# Patient Record
Sex: Male | Born: 1940 | Race: White | Hispanic: No | Marital: Married | Smoking: Former smoker
Health system: Southern US, Community
[De-identification: ages and names within clinical notes are randomized; demographics above are authoritative.]

## PROBLEM LIST (undated history)

## (undated) DIAGNOSIS — I2510 Atherosclerotic heart disease of native coronary artery without angina pectoris: Secondary | ICD-10-CM

## (undated) DIAGNOSIS — N400 Enlarged prostate without lower urinary tract symptoms: Secondary | ICD-10-CM

## (undated) DIAGNOSIS — E7800 Pure hypercholesterolemia, unspecified: Secondary | ICD-10-CM

## (undated) DIAGNOSIS — I10 Essential (primary) hypertension: Secondary | ICD-10-CM

## (undated) HISTORY — DX: Atherosclerotic heart disease of native coronary artery without angina pectoris: I25.10

## (undated) HISTORY — PX: CATARACT EXTRACTION: SUR2

## (undated) HISTORY — DX: Essential (primary) hypertension: I10

## (undated) HISTORY — DX: Pure hypercholesterolemia, unspecified: E78.00

## (undated) HISTORY — PX: INGUINAL HERNIA REPAIR: SUR1180

---

## 1988-11-23 HISTORY — PX: CORONARY ANGIOPLASTY: SHX604

## 2004-09-22 ENCOUNTER — Ambulatory Visit (HOSPITAL_COMMUNITY): Admission: RE | Admit: 2004-09-22 | Discharge: 2004-09-22 | Payer: Self-pay | Admitting: Gastroenterology

## 2018-06-21 ENCOUNTER — Ambulatory Visit: Payer: Self-pay | Admitting: Surgery

## 2018-06-21 ENCOUNTER — Encounter: Payer: Self-pay | Admitting: Surgery

## 2018-06-21 DIAGNOSIS — K4020 Bilateral inguinal hernia, without obstruction or gangrene, not specified as recurrent: Secondary | ICD-10-CM | POA: Insufficient documentation

## 2018-06-21 NOTE — H&P (Signed)
Dustin Mcclure Documented: 06/21/2018 8:36 AM Location: [facility] Patient #: [id number] DOB: 04/30/1941 Married / Language: English / Race: White Male  History of Present Illness Dustin Hector MD; 06/21/2018 1:38 PM) The patient is a 77 year old male who presents with an inguinal hernia. Note for "Inguinal hernia": ` ` ` Patient sent for surgical consultation at the request of Dr Harrington Challenger  Chief Complaint: Large right inguinal hernia. Possible left inguinal hernia ` ` The patient is a pleasant gentleman whose phone a lump in his right groin for many years. Usually not bothersome. However he felt like it gotten larger. Some discomfort. Discussed with primary care physician. Right inguinal hernia confirmed. Suspicion for left inguinal hernia as well. Surgical consultation requested. 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 Has some nocturia. Usually goes once or twice a night. When this felt more swollen and uncomfortable he was going much more often. It concerns him. However since the appointment was made for surgery, he's back down to his baseline nocturia. Denies any difficulty starting or maintaining stream. Not on any prostate medications. 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 He longer smokes and has not had any coronary issues. He walks on his treadmill at home most days. At least 15 minutes. Was his bowels every day. No abdominal surgery. No fevers or chills. He comes in wondering if the hernia needs to be fixed now that it is larger. However he feels better since he saw the primary care physician, so he is undecided.  (Review of systems as stated in this history (HPI) or in the review of systems. Otherwise all other 12 point ROS are negative) ` ` `   Past Surgical History Sabino Gasser; 06/21/2018 8:44 AM) Colon Polyp Removal - Colonoscopy  Diagnostic Studies History Sabino Gasser; 06/21/2018 8:44  AM) Colonoscopy 1-5 years ago  Allergies Sabino Gasser; 06/21/2018 8:44 AM) No Known Drug Allergies [06/21/2018]: Allergies Reconciled  Medication History Sabino Gasser; 06/21/2018 8:45 AM) Atorvastatin Calcium (40MG  Tablet, Oral) Active. Aspirin (81MG  Tablet DR, Oral) Active. Metoprolol Succinate ER (50MG  Tablet ER 24HR, Oral) Active. Medications Reconciled  Social History Sabino Gasser; 06/21/2018 8:44 AM) Alcohol use Moderate alcohol use. Caffeine use Coffee, Tea. No drug use Tobacco use Former smoker.  Family History Sabino Gasser; 06/21/2018 8:44 AM) Heart Disease Brother. Hypertension Brother.  Other Problems Sabino Gasser; 06/21/2018 8:44 AM) High blood pressure Hypercholesterolemia Myocardial infarction Other disease, cancer, significant illness     Review of Systems Sabino Gasser; 06/21/2018 8:44 AM) General Not Present- Appetite Loss, Chills, Fatigue, Fever, Night Sweats, Weight Gain and Weight Loss. Skin Not Present- Change in Wart/Mole, Dryness, Hives, Jaundice, New Lesions, Non-Healing Wounds, Rash and Ulcer. HEENT Present- Seasonal Allergies. Not Present- Earache, Hearing Loss, Hoarseness, Nose Bleed, Oral Ulcers, Ringing in the Ears, Sinus Pain, Sore Throat, Visual Disturbances, Wears glasses/contact lenses and Yellow Eyes. Respiratory Not Present- Bloody sputum, Chronic Cough, Difficulty Breathing, Snoring and Wheezing. Breast Not Present- Breast Mass, Breast Pain, Nipple Discharge and Skin Changes. Cardiovascular Not Present- Chest Pain, Difficulty Breathing Lying Down, Leg Cramps, Palpitations, Rapid Heart Rate, Shortness of Breath and Swelling of Extremities. Gastrointestinal Not Present- Abdominal Pain, Bloating, Bloody Stool, Change in Bowel Habits, Chronic diarrhea, Constipation, Difficulty Swallowing, Excessive gas, Gets full quickly at meals, Hemorrhoids, Indigestion, Nausea, Rectal Pain and Vomiting. Male Genitourinary Present-  Nocturia. Not Present- Blood in Urine, Change in Urinary Stream, Frequency, Impotence, Painful Urination, Urgency and Urine Leakage. Musculoskeletal Not Present- Back  Pain, Joint Pain, Joint Stiffness, Muscle Pain, Muscle Weakness and Swelling of Extremities. Neurological Not Present- Decreased Memory, Fainting, Headaches, Numbness, Seizures, Tingling, Tremor, Trouble walking and Weakness. Psychiatric Not Present- Anxiety, Bipolar, Change in Sleep Pattern, Depression, Fearful and Frequent crying. Endocrine Not Present- Cold Intolerance, Excessive Hunger, Hair Changes, Heat Intolerance, Hot flashes and New Diabetes. Hematology Not Present- Blood Thinners, Easy Bruising, Excessive bleeding, Gland problems, HIV and Persistent Infections.  Vitals Sabino Gasser; 06/21/2018 8:46 AM) 06/21/2018 8:45 AM Weight: 198.13 lb Height: 69in Body Surface Area: 2.06 m Body Mass Index: 29.26 kg/m  Temp.: 99.22F(Oral)  Pulse: 56 (Regular)  BP: 132/84 (Sitting, Left Arm, Standard)      Physical Exam Dustin Hector MD; 06/21/2018 1:39 PM)  General Mental Status-Alert. General Appearance-Not in acute distress, Not Sickly. Orientation-Oriented X3. Hydration-Well hydrated. Voice-Normal.  Integumentary Global Assessment Upon inspection and palpation of skin surfaces of the - Axillae: non-tender, no inflammation or ulceration, no drainage. and Distribution of scalp and body hair is normal. General Characteristics Temperature - normal warmth is noted. 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 Face Global Assessment - atraumatic, no absence of expression. Neck Global Assessment - no abnormal movements, no bruit auscultated on the right, no bruit auscultated on the left, no decreased range of motion, non-tender. Trachea-midline. Thyroid Gland Characteristics -  non-tender.  Eye Eyeball - Left-Extraocular movements intact, No Nystagmus. Eyeball - Right-Extraocular movements intact, No Nystagmus. Cornea - Left-No Hazy. Cornea - Right-No Hazy. Sclera/Conjunctiva - Left-No scleral icterus, No Discharge. Sclera/Conjunctiva - Right-No scleral icterus, No Discharge. Pupil - Left-Direct reaction to light normal. Pupil - Right-Direct reaction to light normal.  ENMT Ears Pinna - Left - no drainage observed, no generalized tenderness observed. Right - no drainage observed, no generalized tenderness observed. Nose and Sinuses External Inspection of the Nose - no destructive lesion observed. Inspection of the nares - Left - quiet respiration. Right - quiet respiration. Mouth and Throat Lips - Upper Lip - no fissures observed, no pallor noted. Lower Lip - no fissures observed, no pallor noted. Nasopharynx - no discharge present. Oral Cavity/Oropharynx - Tongue - no dryness observed. Oral Mucosa - no cyanosis observed. Hypopharynx - no evidence of airway distress observed.  Chest and Lung Exam Inspection Movements - Normal and Symmetrical. Accessory muscles - No use of accessory muscles in breathing. Palpation Palpation of the chest reveals - Non-tender. Auscultation Breath sounds - Normal and Clear.  Cardiovascular Auscultation Rhythm - Regular. Murmurs & Other Heart Sounds - Auscultation of the heart reveals - No Murmurs and No Systolic Clicks.  Abdomen Inspection Inspection of the abdomen reveals - No Visible peristalsis and No Abnormal pulsations. Umbilicus - No Bleeding, No Urine drainage. Palpation/Percussion Palpation and Percussion of the abdomen reveal - Soft, Non Tender, No Rebound tenderness, No Rigidity (guarding) and No Cutaneous hyperesthesia. Note: Abdomen soft. Nontender. Not distended. No umbilical or incisional hernias. No guarding. No diastases  Male Genitourinary Sexual Maturity Tanner 5 - Adult hair  pattern and Adult penile size and shape. Note: Right greater than the left bilateral inguinal hernias. Right side was about 8 cm groin mass. Ultimately reducible. Testes cords and phallus otherwise normal.  Peripheral Vascular Upper Extremity Inspection - Left - No Cyanotic nailbeds, Not Ischemic. Right - No Cyanotic nailbeds, Not Ischemic.  Neurologic Neurologic evaluation reveals -normal attention span and ability to concentrate, able to name objects and repeat phrases. Appropriate fund of  knowledge , normal sensation and normal coordination. Mental Status Affect - not angry, not paranoid. Cranial Nerves-Normal Bilaterally. Gait-Normal.  Neuropsychiatric Mental status exam performed with findings of-able to articulate well with normal speech/language, rate, volume and coherence, thought content normal with ability to perform basic computations and apply abstract reasoning and no evidence of hallucinations, delusions, obsessions or homicidal/suicidal ideation.  Musculoskeletal Global Assessment Spine, Ribs and Pelvis - no instability, subluxation or laxity. Right Upper Extremity - no instability, subluxation or laxity.  Lymphatic Head & Neck  General Head & Neck Lymphatics: Bilateral - Description - No Localized lymphadenopathy. Axillary  General Axillary Region: Bilateral - Description - No Localized lymphadenopathy. Femoral & Inguinal  Generalized Femoral & Inguinal Lymphatics: Left - Description - No Localized lymphadenopathy. Right - Description - No Localized lymphadenopathy.    Assessment & Plan Dustin Hector MD; 06/21/2018 9:26 AM)  BILATERAL INGUINAL HERNIA WITHOUT OBSTRUCTION OR GANGRENE, RECURRENCE NOT SPECIFIED (K40.20) Impression: Bilateral inguinal hernias. Left smaller. Right moderate sized sent groin started to go down the scrotum.  Standard of care would be repair. Laparoscopic approach with mesh. Perhaps use a medium density on the right side at  the defect is large. Outpatient surgery.  He is interested in proceeding. Due to ago for summer vacation up in [place]. I think it may be wiser to wait until after that vacation and do this in September. It is not been severely debilitating despite the increase in size and intermittent discomfort in the past few weeks. Later this week. He is comfortable with waiting since I cannot do it immediately and I doubt he would be back to normal just a couple weeks before his vacation. We will work to coordinate a convenient time   Corder SURGICAL PROCEDURE (Z01.818)  Current Plans You are being scheduled for surgery- Our schedulers will call you.  You should hear from our office's scheduling department within 5 working days about the location, date, and time of surgery. We try to make accommodations for patient's preferences in scheduling surgery, but sometimes the OR schedule or the surgeon's schedule prevents [place] from making those accommodations.  If you have not heard from our office [phone]) in 5 working days, call the office and ask for your surgeon's nurse.  If you have other questions about your diagnosis, plan, or surgery, call the office and ask for your surgeon's nurse.  Written instructions provided The anatomy & physiology of the abdominal wall and pelvic floor was discussed. The pathophysiology of hernias in the inguinal and pelvic region was discussed. Natural history risks such as progressive enlargement, pain, incarceration, and strangulation was discussed. Contributors to complications such as smoking, obesity, diabetes, prior surgery, etc were discussed.  I feel the risks of no intervention will lead to serious problems that outweigh the operative risks; therefore, I recommended surgery to reduce and repair the hernia. I explained laparoscopic techniques with possible need for an open approach. I noted  usual use of mesh to patch and/or buttress hernia repair  Risks such as bleeding, infection, abscess, need for further treatment, heart attack, death, and other risks were discussed. I noted a good likelihood this will help address the problem. Goals of post-operative recovery were discussed as well. Possibility that this will not correct all symptoms was explained. I stressed the importance of low-impact activity, aggressive pain control, avoiding constipation, & not pushing through pain to minimize risk of post-operative chronic pain or injury. Possibility  of reherniation was discussed. We will work to minimize complications.  An educational handout further explaining the pathology & treatment options was given as well. Questions were answered. The patient expresses understanding & wishes to proceed with surgery.  Pt Education - Pamphlet Given - Laparoscopic Hernia Repair: discussed with patient and provided information. Pt Education - CCS Pain Control (Yuto Cajuste) Pt Education - CCS Hernia Post-Op HCI (Sanjana Folz): discussed with patient and provided information. Pt Education - CCS Mesh education: discussed with patient and provided information.  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  I'm skeptical that his bladder is in the hernia but that is a possibility.  I think it would be a good idea to place him on Flomax perioperatively to minimize risk of urinary retention. Low threshold to have him recheck to his urologist if it is a persistent or worsening symptoms. Perhaps his urination will get better once the hernias were fixed. We will see.  Current Plans Started Tamsulosin HCl 0.4 MG Oral Capsule, 1 (one) Capsule daily, #14, 14 days starting 06/21/2018, Ref. x2. Local Order: Start 1 week before hernia surgery to help with urination  Dustin Hector, MD, FACS, MASCRS Gastrointestinal and Minimally  Invasive Surgery    [address], [place], [place] [address] [phone] Main / Paging [phone] Fax

## 2020-12-12 DIAGNOSIS — I10 Essential (primary) hypertension: Secondary | ICD-10-CM | POA: Diagnosis not present

## 2020-12-12 DIAGNOSIS — Z0000 Encounter for general adult medical examination without abnormal findings: Secondary | ICD-10-CM | POA: Diagnosis not present

## 2020-12-12 DIAGNOSIS — E782 Mixed hyperlipidemia: Secondary | ICD-10-CM | POA: Diagnosis not present

## 2021-03-31 ENCOUNTER — Ambulatory Visit: Payer: Medicare Other | Admitting: Podiatry

## 2021-03-31 ENCOUNTER — Other Ambulatory Visit: Payer: Self-pay

## 2021-03-31 ENCOUNTER — Ambulatory Visit (INDEPENDENT_AMBULATORY_CARE_PROVIDER_SITE_OTHER): Payer: Medicare Other

## 2021-03-31 DIAGNOSIS — M2041 Other hammer toe(s) (acquired), right foot: Secondary | ICD-10-CM

## 2021-03-31 DIAGNOSIS — M2042 Other hammer toe(s) (acquired), left foot: Secondary | ICD-10-CM | POA: Diagnosis not present

## 2021-03-31 DIAGNOSIS — M205X2 Other deformities of toe(s) (acquired), left foot: Secondary | ICD-10-CM

## 2021-04-14 NOTE — Progress Notes (Signed)
   HPI: 80 y.o. male presenting today as a new patient for evaluation of intermittent low-grade foot pain to the left foot.  Is been going on for less than a year.  Gradual onset.  She denies a history of injury.  Occasionally she will have some pain on the ball of her foot.  She also has a hammertoe to the left second toe that she states is completely not painful.  She presents for further treatment evaluation.  Past Medical History:  Diagnosis Date  . CAD (coronary artery disease)   . HTN (hypertension)   . Hypercholesteremia      Physical Exam: General: The patient is alert and oriented x3 in no acute distress.  Dermatology: Skin is warm, dry and supple bilateral lower extremities. Negative for open lesions or macerations.  Vascular: Palpable pedal pulses bilaterally. No edema or erythema noted. Capillary refill within normal limits.  Neurological: Epicritic and protective threshold grossly intact bilaterally.   Musculoskeletal Exam: Range of motion within normal limits to all pedal and ankle joints bilateral. Muscle strength 5/5 in all groups bilateral.  There is some limited range of motion with very mild tenderness to palpation to the first MTPJ left consistent with a hallux limitus.  Reducible hammertoe deformity also noted to the second digit left  Radiographic Exam:  Normal osseous mineralization.  There is some joint space narrowing to the first MTPJ left. No fracture/dislocation/boney destruction.    Assessment: 1.  Hallux limitus left 2.  Hammertoe second digit left   Plan of Care:  1. Patient evaluated. X-Rays reviewed.  2.  Today we discussed conservative versus surgical management of the patient's pathology.  The patient would not like to pursue any surgery at this time.  I agree.  Currently it seems to be asymptomatic and very mild and intermittent. 3.  Continue wearing new balance shoes and good stability sneakers 4.  Return to clinic as needed      Edrick Kins, DPM [facility]  Dr. Edrick Kins, DPM    [address], [place] [address]                Office [phone]  Fax [phone]

## 2021-06-27 DIAGNOSIS — M79606 Pain in leg, unspecified: Secondary | ICD-10-CM | POA: Diagnosis not present

## 2021-07-22 DIAGNOSIS — Z1152 Encounter for screening for COVID-19: Secondary | ICD-10-CM | POA: Diagnosis not present

## 2021-08-20 DIAGNOSIS — M79605 Pain in left leg: Secondary | ICD-10-CM | POA: Diagnosis not present

## 2021-08-21 ENCOUNTER — Other Ambulatory Visit: Payer: Self-pay | Admitting: Sports Medicine

## 2021-08-21 ENCOUNTER — Ambulatory Visit
Admission: RE | Admit: 2021-08-21 | Discharge: 2021-08-21 | Disposition: A | Discharge status: Home / Self Care | Payer: Medicare Other | Source: Ambulatory Visit | Attending: Sports Medicine | Admitting: Sports Medicine

## 2021-08-21 ENCOUNTER — Other Ambulatory Visit: Payer: Self-pay

## 2021-08-21 DIAGNOSIS — M5440 Lumbago with sciatica, unspecified side: Secondary | ICD-10-CM

## 2021-08-21 DIAGNOSIS — M545 Low back pain, unspecified: Secondary | ICD-10-CM | POA: Diagnosis not present

## 2021-08-21 DIAGNOSIS — M25552 Pain in left hip: Secondary | ICD-10-CM

## 2021-09-01 DIAGNOSIS — M62552 Muscle wasting and atrophy, not elsewhere classified, left thigh: Secondary | ICD-10-CM | POA: Diagnosis not present

## 2021-09-01 DIAGNOSIS — M25652 Stiffness of left hip, not elsewhere classified: Secondary | ICD-10-CM | POA: Diagnosis not present

## 2021-09-01 DIAGNOSIS — M79605 Pain in left leg: Secondary | ICD-10-CM | POA: Diagnosis not present

## 2021-09-01 DIAGNOSIS — M62562 Muscle wasting and atrophy, not elsewhere classified, left lower leg: Secondary | ICD-10-CM | POA: Diagnosis not present

## 2021-09-01 DIAGNOSIS — R262 Difficulty in walking, not elsewhere classified: Secondary | ICD-10-CM | POA: Diagnosis not present

## 2021-09-01 DIAGNOSIS — R269 Unspecified abnormalities of gait and mobility: Secondary | ICD-10-CM | POA: Diagnosis not present

## 2021-09-01 DIAGNOSIS — R2681 Unsteadiness on feet: Secondary | ICD-10-CM | POA: Diagnosis not present

## 2021-12-10 DIAGNOSIS — I10 Essential (primary) hypertension: Secondary | ICD-10-CM | POA: Diagnosis not present

## 2021-12-10 DIAGNOSIS — E782 Mixed hyperlipidemia: Secondary | ICD-10-CM | POA: Diagnosis not present

## 2021-12-18 DIAGNOSIS — I10 Essential (primary) hypertension: Secondary | ICD-10-CM | POA: Diagnosis not present

## 2021-12-18 DIAGNOSIS — H1132 Conjunctival hemorrhage, left eye: Secondary | ICD-10-CM | POA: Diagnosis not present

## 2021-12-18 DIAGNOSIS — Z0000 Encounter for general adult medical examination without abnormal findings: Secondary | ICD-10-CM | POA: Diagnosis not present

## 2021-12-18 DIAGNOSIS — E782 Mixed hyperlipidemia: Secondary | ICD-10-CM | POA: Diagnosis not present

## 2021-12-26 ENCOUNTER — Emergency Department (HOSPITAL_COMMUNITY): Payer: Medicare Other

## 2021-12-26 ENCOUNTER — Other Ambulatory Visit: Payer: Self-pay

## 2021-12-26 ENCOUNTER — Emergency Department (HOSPITAL_COMMUNITY)
Admission: EM | Admit: 2021-12-26 | Discharge: 2021-12-27 | Disposition: A | Discharge status: Home / Self Care | Payer: Medicare Other | Attending: Emergency Medicine | Admitting: Emergency Medicine

## 2021-12-26 DIAGNOSIS — I10 Essential (primary) hypertension: Secondary | ICD-10-CM | POA: Insufficient documentation

## 2021-12-26 DIAGNOSIS — R079 Chest pain, unspecified: Secondary | ICD-10-CM | POA: Diagnosis not present

## 2021-12-26 DIAGNOSIS — R0789 Other chest pain: Secondary | ICD-10-CM | POA: Insufficient documentation

## 2021-12-26 DIAGNOSIS — I2510 Atherosclerotic heart disease of native coronary artery without angina pectoris: Secondary | ICD-10-CM | POA: Diagnosis not present

## 2021-12-26 DIAGNOSIS — Z7982 Long term (current) use of aspirin: Secondary | ICD-10-CM | POA: Insufficient documentation

## 2021-12-26 DIAGNOSIS — R072 Precordial pain: Secondary | ICD-10-CM | POA: Diagnosis not present

## 2021-12-26 DIAGNOSIS — Z79899 Other long term (current) drug therapy: Secondary | ICD-10-CM | POA: Insufficient documentation

## 2021-12-26 LAB — BASIC METABOLIC PANEL
Anion gap: 6 (ref 5–15)
BUN: 20 mg/dL (ref 8–23)
CO2: 29 mmol/L (ref 22–32)
Calcium: 9.2 mg/dL (ref 8.9–10.3)
Chloride: 99 mmol/L (ref 98–111)
Creatinine, Ser: 0.85 mg/dL (ref 0.61–1.24)
GFR, Estimated: >60 mL/min (ref >60)
Glucose, Bld: 107 mg/dL — ABNORMAL HIGH (ref 70–99)
Potassium: 4.4 mmol/L (ref 3.5–5.1)
Sodium: 134 mmol/L — ABNORMAL LOW (ref 135–145)

## 2021-12-26 LAB — CBC
HCT: 42.7 % (ref 39.0–52.0)
Hemoglobin: 14.4 g/dL (ref 13.0–17.0)
MCH: 30.1 pg (ref 26.0–34.0)
MCHC: 33.7 g/dL (ref 30.0–36.0)
MCV: 89.1 fL (ref 80.0–100.0)
Platelets: 218 10*3/uL (ref 150–400)
RBC: 4.79 MIL/uL (ref 4.22–5.81)
RDW: 12.9 % (ref 11.5–15.5)
WBC: 6.1 10*3/uL (ref 4.0–10.5)
nRBC: 0 % (ref 0.0–0.2)

## 2021-12-26 LAB — HEPATIC FUNCTION PANEL
ALT: 18 U/L (ref 0–44)
AST: 21 U/L (ref 15–41)
Albumin: 4.1 g/dL (ref 3.5–5.0)
Alkaline Phosphatase: 68 U/L (ref 38–126)
Bilirubin, Direct: 0.2 mg/dL (ref 0.0–0.2)
Indirect Bilirubin: 0.6 mg/dL (ref 0.3–0.9)
Total Bilirubin: 0.8 mg/dL (ref 0.3–1.2)
Total Protein: 6.9 g/dL (ref 6.5–8.1)

## 2021-12-26 LAB — TROPONIN I (HIGH SENSITIVITY): Troponin I (High Sensitivity): 5 ng/L (ref <18)

## 2021-12-26 MED ORDER — PANTOPRAZOLE SODIUM 40 MG PO TBEC
40.0000 mg | DELAYED_RELEASE_TABLET | Freq: Every day | ORAL | Status: DC
Start: 2021-12-26 — End: 2021-12-27
  Administered 2021-12-26: 40 mg via ORAL
  Filled 2021-12-26: qty 1

## 2021-12-26 NOTE — ED Provider Notes (Signed)
[facility] DEPT Provider Note   CSN: [id number] Arrival date & time: 12/26/21  2032     History  Chief Complaint  Patient presents with   Chest Pain    Dustin Mcclure is a 81 y.o. male.  Pt is a 81 yo wm who has a hx of CAD, HTN, and high cholesterol.  Pt ate some spicy foods tonight, then developed some CP.  He took some tums which helped.  He did have a MI 32 years ago, but has not seen a cardiologist in years.  He does follow closely with his pcp.  He just saw his pcp last week for an annual exam and things were looking good.  Pt still had some cp in triage, so he was given a protonix.  He has no cp now.  He did have elevation in his bp at home as well.  This worried him more than the cp.      Home Medications Prior to Admission medications   Medication Sig Start Date End Date Taking? Authorizing Provider  aspirin EC 81 MG tablet Take 81 mg by mouth daily. Swallow whole.   Yes [provider]  finasteride (PROSCAR) 5 MG tablet Take 5 mg by mouth daily. 09/05/21  Yes [provider]  metoprolol succinate (TOPROL-XL) 50 MG 24 hr tablet Take 50 mg by mouth in the morning. 11/20/21  Yes [provider]  Saccharomyces boulardii (PROBIOTIC) 250 MG CAPS Take 250 mg by mouth 3 (three) times a week.   Yes [provider]  vardenafil (LEVITRA) 20 MG tablet Take 10-20 mg by mouth daily as needed (as directed).   Yes [provider]  atorvastatin (LIPITOR) 40 MG tablet Take 40 mg by mouth See admin instructions. Take 40 mg by mouth every other NIGHT 12/08/21   [provider]      Allergies    Patient has no known allergies.    Review of Systems   Review of Systems  Cardiovascular:  Positive for chest pain.  All other systems reviewed and are negative.  Physical Exam Updated Vital Signs BP (!) 170/80    Pulse 66    Temp 98.7 F (37.1 C) (Oral)    Resp (!) 23    Ht 5\' 7"  (1.702 m)    Wt 83 kg    SpO2  99%    BMI 28.66 kg/m  Physical Exam Vitals and nursing note reviewed.  Constitutional:      Appearance: He is well-developed.  HENT:     Head: Normocephalic and atraumatic.  Eyes:     Extraocular Movements: Extraocular movements intact.     Pupils: Pupils are equal, round, and reactive to light.  Cardiovascular:     Rate and Rhythm: Normal rate and regular rhythm.     Heart sounds: Normal heart sounds.  Pulmonary:     Effort: Pulmonary effort is normal.     Breath sounds: Normal breath sounds.  Abdominal:     General: Bowel sounds are normal.     Palpations: Abdomen is soft.  Musculoskeletal:        General: Normal range of motion.     Cervical back: Normal range of motion and neck supple.  Skin:    General: Skin is warm.     Capillary Refill: Capillary refill takes less than 2 seconds.     Comments: Vitiligo noted  Neurological:     General: No focal deficit present.     Mental Status:  He is alert and oriented to person, place, and time.  Psychiatric:        Mood and Affect: Mood normal.        Behavior: Behavior normal.    ED Results / Procedures / Treatments   Labs (all labs ordered are listed, but only abnormal results are displayed) Labs Reviewed  BASIC METABOLIC PANEL - Abnormal; Notable for the following components:      Result Value   Sodium 134 (*)    Glucose, Bld 107 (*)    All other components within normal limits  CBC  HEPATIC FUNCTION PANEL  D-DIMER, QUANTITATIVE  TROPONIN I (HIGH SENSITIVITY)  TROPONIN I (HIGH SENSITIVITY)    EKG EKG Interpretation  Date/Time:  Friday December 26 2021 20:49:32 EST Ventricular Rate:  55 PR Interval:  177 QRS Duration: 99 QT Interval:  436 QTC Calculation: 417 R Axis:   27 Text Interpretation: Sinus rhythm Abnrm T, consider ischemia, anterolateral lds No old tracing to compare Confirmed by Dorie Rank [phone]) on 12/26/2021 8:56:02 PM  Radiology DG Chest 2 View  Result Date: 12/26/2021 CLINICAL DATA:  Chest  pain and discomfort.  Hypertension. EXAM: CHEST - 2 VIEW COMPARISON:  None. FINDINGS: Heart size is normal. Mild tortuosity of the aorta. Pulmonary vascularity is normal. The lungs are clear. No effusions. Ordinary degenerative changes affect the spine. IMPRESSION: No active cardiopulmonary disease. Electronically Signed   By: Nelson Chimes M.D.   On: 12/26/2021 21:16    Procedures Procedures    Medications Ordered in ED Medications  pantoprazole (PROTONIX) EC tablet 40 mg (40 mg Oral Given 12/26/21 2103)    ED Course/ Medical Decision Making/ A&P                           Medical Decision Making Amount and/or Complexity of Data Reviewed Labs: ordered. Radiology: ordered.   Pt is evaluated and cardiac labs are nl.  EKG does have some T wave changes.  CXR neg.  Pain is better after tums and protonix.  As pt does have a hx of CAD, he will be referred to cardiology.  BP is improved with rest.  If 2nd trop is neg, he can go home.  Pt and his wife know the plan.         Final Clinical Impression(s) / ED Diagnoses Final diagnoses:  Atypical chest pain    Rx / DC Orders ED Discharge Orders          Ordered    Ambulatory referral to Cardiology        12/27/21 0010              Isla Pence, MD 12/27/21 0010

## 2021-12-26 NOTE — ED Triage Notes (Signed)
Pt complains of chest pain after eating something spicy this afternoon. Pt states that his blood pressure is higher than normal.

## 2021-12-26 NOTE — ED Notes (Signed)
Lab will add on to prior collection

## 2021-12-26 NOTE — ED Provider Triage Note (Signed)
Emergency Medicine Provider Triage Evaluation Note  Dustin Mcclure , a 81 y.o. male  was evaluated in triage.  Pt complains of chest pain that started 1 hour ago after eating spicy food.  Patient reports history of MI 32 years ago, has not seen cardiologist since.  Patient describes the chest pain as a pressure, denies radiation, denies worsening of chest pain with exertion.  Review of Systems  Positive: Chest pain, shortness of breath Negative: Nausea, vomiting, diarrhea  Physical Exam  BP (!) 174/122 (BP Location: Left Arm)    Pulse (!) 59    Temp 98.7 F (37.1 C) (Oral)    Resp 16    Ht 5\' 7"  (1.702 m)    Wt 83 kg    SpO2 100%    BMI 28.66 kg/m  Gen:   Awake, no distress   Resp:  Normal effort  MSK:   Moves extremities without difficulty  Other:  No lower extremity swelling. Clear lungs  Medical Decision Making  Medically screening exam initiated at 8:55 PM.  Appropriate orders placed.  Dustin Mcclure was informed that the remainder of the evaluation will be completed by another provider, this initial triage assessment does not replace that evaluation, and the importance of remaining in the ED until their evaluation is complete.     Azucena Cecil, PA-C 12/26/21 2056

## 2021-12-27 LAB — TROPONIN I (HIGH SENSITIVITY): Troponin I (High Sensitivity): 6 ng/L (ref <18)

## 2021-12-27 LAB — D-DIMER, QUANTITATIVE: D-Dimer, Quant: <0.27 ug/mL-FEU (ref 0.00–0.50)

## 2021-12-27 NOTE — ED Provider Notes (Signed)
I assumed care in signout to follow-up on labs.  Patient reports he had an episode of chest pain after eating food with spicy sauce on it.  He reports he did become mildly diaphoretic but no other acute symptoms.  No shortness of breath, no vomiting, the pain did not radiate.  Patient reports all the symptoms are resolved.  He was mostly concerned about his elevated blood pressure. Patient is requesting discharge home.  Both of his troponin markers are negative, but he does have an abnormal EKG but is unclear how acute these findings are. Patient will be referred to cardiology   Ripley Fraise, MD 12/27/21 [phone]

## 2021-12-30 ENCOUNTER — Emergency Department (HOSPITAL_COMMUNITY): Payer: Medicare Other

## 2021-12-30 ENCOUNTER — Emergency Department (HOSPITAL_COMMUNITY)
Admission: EM | Admit: 2021-12-30 | Discharge: 2021-12-31 | Disposition: A | Discharge status: Home / Self Care | Payer: Medicare Other | Attending: Emergency Medicine | Admitting: Emergency Medicine

## 2021-12-30 DIAGNOSIS — I2510 Atherosclerotic heart disease of native coronary artery without angina pectoris: Secondary | ICD-10-CM | POA: Diagnosis not present

## 2021-12-30 DIAGNOSIS — R0789 Other chest pain: Secondary | ICD-10-CM | POA: Insufficient documentation

## 2021-12-30 DIAGNOSIS — I119 Hypertensive heart disease without heart failure: Secondary | ICD-10-CM | POA: Insufficient documentation

## 2021-12-30 DIAGNOSIS — Z7982 Long term (current) use of aspirin: Secondary | ICD-10-CM | POA: Diagnosis not present

## 2021-12-30 DIAGNOSIS — R079 Chest pain, unspecified: Secondary | ICD-10-CM

## 2021-12-30 DIAGNOSIS — Z743 Need for continuous supervision: Secondary | ICD-10-CM | POA: Diagnosis not present

## 2021-12-30 DIAGNOSIS — R6889 Other general symptoms and signs: Secondary | ICD-10-CM | POA: Diagnosis not present

## 2021-12-30 DIAGNOSIS — Z79899 Other long term (current) drug therapy: Secondary | ICD-10-CM | POA: Diagnosis not present

## 2021-12-30 DIAGNOSIS — R112 Nausea with vomiting, unspecified: Secondary | ICD-10-CM | POA: Diagnosis not present

## 2021-12-30 DIAGNOSIS — R61 Generalized hyperhidrosis: Secondary | ICD-10-CM | POA: Insufficient documentation

## 2021-12-30 DIAGNOSIS — K2100 Gastro-esophageal reflux disease with esophagitis, without bleeding: Secondary | ICD-10-CM | POA: Diagnosis not present

## 2021-12-30 LAB — CBC WITH DIFFERENTIAL/PLATELET
Abs Immature Granulocytes: 0.02 10*3/uL (ref 0.00–0.07)
Basophils Absolute: 0 10*3/uL (ref 0.0–0.1)
Basophils Relative: 0 %
Eosinophils Absolute: 0.1 10*3/uL (ref 0.0–0.5)
Eosinophils Relative: 2 %
HCT: 40.6 % (ref 39.0–52.0)
Hemoglobin: 13.5 g/dL (ref 13.0–17.0)
Immature Granulocytes: 0 %
Lymphocytes Relative: 19 %
Lymphs Abs: 1.6 10*3/uL (ref 0.7–4.0)
MCH: 29.7 pg (ref 26.0–34.0)
MCHC: 33.3 g/dL (ref 30.0–36.0)
MCV: 89.4 fL (ref 80.0–100.0)
Monocytes Absolute: 0.7 10*3/uL (ref 0.1–1.0)
Monocytes Relative: 9 %
Neutro Abs: 5.7 10*3/uL (ref 1.7–7.7)
Neutrophils Relative %: 70 %
Platelets: 199 10*3/uL (ref 150–400)
RBC: 4.54 MIL/uL (ref 4.22–5.81)
RDW: 12.7 % (ref 11.5–15.5)
WBC: 8.3 10*3/uL (ref 4.0–10.5)
nRBC: 0 % (ref 0.0–0.2)

## 2021-12-30 LAB — COMPREHENSIVE METABOLIC PANEL
ALT: 17 U/L (ref 0–44)
AST: 19 U/L (ref 15–41)
Albumin: 3.9 g/dL (ref 3.5–5.0)
Alkaline Phosphatase: 72 U/L (ref 38–126)
Anion gap: 9 (ref 5–15)
BUN: 10 mg/dL (ref 8–23)
CO2: 27 mmol/L (ref 22–32)
Calcium: 9.3 mg/dL (ref 8.9–10.3)
Chloride: 96 mmol/L — ABNORMAL LOW (ref 98–111)
Creatinine, Ser: 0.72 mg/dL (ref 0.61–1.24)
GFR, Estimated: >60 mL/min (ref >60)
Glucose, Bld: 111 mg/dL — ABNORMAL HIGH (ref 70–99)
Potassium: 3.9 mmol/L (ref 3.5–5.1)
Sodium: 132 mmol/L — ABNORMAL LOW (ref 135–145)
Total Bilirubin: 1.4 mg/dL — ABNORMAL HIGH (ref 0.3–1.2)
Total Protein: 6.4 g/dL — ABNORMAL LOW (ref 6.5–8.1)

## 2021-12-30 LAB — TROPONIN I (HIGH SENSITIVITY)
Troponin I (High Sensitivity): 10 ng/L (ref <18)
Troponin I (High Sensitivity): 9 ng/L (ref <18)

## 2021-12-30 LAB — LIPASE, BLOOD: Lipase: 32 U/L (ref 11–51)

## 2021-12-30 NOTE — ED Provider Triage Note (Signed)
Emergency Medicine Provider Triage Evaluation Note  Saahir Prude , a 81 y.o. male  was evaluated in triage.  Pt complains of increased flatulence and belching.  Patient went to his PCP today for follow-up after being seen in the emergency department for chest pain.  EMS reports that physician was concerned for elevation in V2 and V3 and sent him to the emergency department for further evaluation.  Patient reports that he has not had any chest pain.  States that his increased flatulence and belching started at 1 AM this morning.  Has been constant since then.  Patient has tried over-the-counter medication with no improvement in his symptoms.  Review of Systems  Positive: Increased belching, increased flatulence Negative: Chest pain, shortness of breath, nausea, vomiting, diaphoresis, abdominal pain  Physical Exam  BP (!) 177/84 (BP Location: Right Arm)    Pulse (!) 54    Temp 99.5 F (37.5 C) (Oral)    Resp 16    SpO2 98%  Gen:   Awake, no distress   Resp:  Normal effort, lungs clear to auscultation bilaterally MSK:   Moves extremities without difficulty  Other:  +2 radial pulse bilaterally.  Abdomen soft, nondistended, nontender.  Medical Decision Making  Medically screening exam initiated at 4:13 PM.  Appropriate orders placed.  Paige Mura was informed that the remainder of the evaluation will be completed by another provider, this initial triage assessment does not replace that evaluation, and the importance of remaining in the ED until their evaluation is complete.     Loni Beckwith, [place] 12/30/21 1615

## 2021-12-30 NOTE — ED Triage Notes (Signed)
Pt via [facility] for eval of increased belching/ flatulence & indigestion since 0100, saw PCP/PA today for follow up from [facility] admission last week. Hx MI 28yrs ago  187/88 HR 56

## 2021-12-31 MED ORDER — OMEPRAZOLE 20 MG PO CPDR: 20.0000 mg | DELAYED_RELEASE_CAPSULE | Freq: Every day | ORAL | 0 refills | Status: DC

## 2021-12-31 NOTE — Discharge Instructions (Addendum)
I prescribed you a proton pump inhibitor called omeprazole.  You can take this once per day for the next 2 weeks.  See if this improves your symptoms.  I would also recommend working on smaller portions when eating as well as not eating just prior to bed.  Please follow-up with your regular doctor regarding this visit.  I would also recommend a cardiology follow-up.  If you develop any new or worsening symptoms please come back to the emergency department.  If you develop any new or worsening symptoms please come back to the emergency department.

## 2021-12-31 NOTE — ED Provider Notes (Signed)
[name] Provider Note   CSN: [id number] Arrival date & time: 12/30/21  1555     History  Chief Complaint  Patient presents with   Chest Pain    Amiir Mcclure is a 81 y.o. male.  HPI Patient is a 81 year old male with history of hyperlipidemia, hypertension, CAD, MI 33 years ago, who presents to the emergency department due to chest pain.  Patient was initially seen in the [facility] emergency department 5 days ago.  He had an episode of chest pain after eating spicy food.  He became mildly diaphoretic with no other acute symptoms.  Patient was recommended to follow-up with his PCP as well as cardiology.  He states that since being discharged she has continued to have intermittent chest pain.  States that it is typically after eating.  States it starts in his epigastrium and he has increased burping and flatulence which typically improves his symptoms.  He followed up with his PCP today and they noted inverted T waves and possible ST changes and he was sent to the emergency department for reevaluation.  States that since coming to the emergency department he has had no abdominal pain or chest pain.  Denies any symptoms since coming to the emergency department.    Home Medications Prior to Admission medications   Medication Sig Start Date End Date Taking? Authorizing Provider  omeprazole (PRILOSEC) 20 MG capsule Take 1 capsule (20 mg total) by mouth daily. 12/31/21 01/30/22 Yes Rayna Sexton, PA-C  aspirin EC 81 MG tablet Take 81 mg by mouth daily. Swallow whole.    [provider]  atorvastatin (LIPITOR) 40 MG tablet Take 40 mg by mouth See admin instructions. Take 40 mg by mouth every other NIGHT 12/08/21   [provider]  finasteride (PROSCAR) 5 MG tablet Take 5 mg by mouth daily. 09/05/21   [provider]  metoprolol succinate (TOPROL-XL) 50 MG 24 hr tablet Take 50 mg by mouth in the morning. 11/20/21   [provider]  Saccharomyces boulardii (PROBIOTIC) 250 MG CAPS Take 250 mg by mouth 3 (three) times a week.    [provider]  vardenafil (LEVITRA) 20 MG tablet Take 10-20 mg by mouth daily as needed (as directed).    [provider]      Allergies    Patient has no known allergies.    Review of Systems   Review of Systems  All other systems reviewed and are negative. Ten systems reviewed and are negative for acute change, except as noted in the HPI.   Physical Exam Updated Vital Signs BP 129/74 (BP Location: Left Arm)    Pulse (!) 48    Temp 98.4 F (36.9 C) (Oral)    Resp 19    SpO2 99%  Physical Exam Vitals and nursing note reviewed.  Constitutional:      General: He is not in acute distress.    Appearance: Normal appearance. He is well-developed. He is not ill-appearing, toxic-appearing or diaphoretic.  HENT:     Head: Normocephalic and atraumatic.     Right Ear: External ear normal.     Left Ear: External ear normal.     Nose: Nose normal.     Mouth/Throat:     Mouth: Mucous membranes are moist.     Pharynx: Oropharynx is clear. No oropharyngeal exudate or posterior oropharyngeal erythema.  Eyes:     Extraocular Movements: Extraocular movements intact.  Cardiovascular:  Rate and Rhythm: Normal rate and regular rhythm.     Pulses: Normal pulses.          Radial pulses are 2+ on the right side and 2+ on the left side.       Dorsalis pedis pulses are 2+ on the right side and 2+ on the left side.     Heart sounds: Normal heart sounds. Heart sounds not distant. No murmur heard. No systolic murmur is present.  No diastolic murmur is present.    No friction rub. No gallop. No S3 or S4 sounds.  Pulmonary:     Effort: Pulmonary effort is normal. No tachypnea or respiratory distress.     Breath sounds: Normal breath sounds. No stridor. No decreased breath sounds, wheezing, rhonchi or rales.  Abdominal:     General: Abdomen is flat.     Palpations:  Abdomen is soft.     Tenderness: There is no abdominal tenderness.     Comments: Abdomen is soft and non-tender.   Musculoskeletal:        General: Normal range of motion.     Cervical back: Normal range of motion and neck supple. No tenderness.     Right lower leg: No edema.     Left lower leg: No edema.  Skin:    General: Skin is warm and dry.  Neurological:     General: No focal deficit present.     Mental Status: He is alert and oriented to person, place, and time.  Psychiatric:        Mood and Affect: Mood normal.        Behavior: Behavior normal.   ED Results / Procedures / Treatments   Labs (all labs ordered are listed, but only abnormal results are displayed) Labs Reviewed  COMPREHENSIVE METABOLIC PANEL - Abnormal; Notable for the following components:      Result Value   Sodium 132 (*)    Chloride 96 (*)    Glucose, Bld 111 (*)    Total Protein 6.4 (*)    Total Bilirubin 1.4 (*)    All other components within normal limits  CBC WITH DIFFERENTIAL/PLATELET  LIPASE, BLOOD  TROPONIN I (HIGH SENSITIVITY)  TROPONIN I (HIGH SENSITIVITY)   EKG None  Radiology DG Chest 2 View  Result Date: 12/30/2021 CLINICAL DATA:  Chest pain EXAM: CHEST - 2 VIEW COMPARISON:  December 26, 2021 FINDINGS: The heart size and mediastinal contours are within normal limits. No new consolidation or edema. No pleural effusion or pneumothorax. No acute osseous abnormality. IMPRESSION: No active cardiopulmonary disease. Electronically Signed   By: Macy Mis M.D.   On: 12/30/2021 16:52    Procedures Procedures   Medications Ordered in ED Medications - No data to display  ED Course/ Medical Decision Making/ A&P                           Medical Decision Making Pt is a 81 y.o. male who presents to the ED due to intermittent CP.   Labs: CBC without abnormalities. CMP with a sodium of 132, chloride of 96, glucose of 111, total protein is 6.4, total bilirubin of 1.4. Lipase of  32. Troponin of 10 with a repeat of 9.  Imaging: Chest x-ray shows no active disease.  I, Rayna Sexton, PA-C, personally reviewed and evaluated these images and lab results as part of my medical decision-making.  Symptoms appear consistent with GERD.  Patient's ECG does  not appear ischemic.  Chest x-ray is negative.  Troponins are flat.  Doubt ACS at this time.  No tachycardia or hypoxia.  Patient denies any shortness of breath.  Exam not consistent with PE or dissection.  He does note that his symptoms typically occur after eating and during his last visit to the emergency department 5 days ago his symptoms began after eating spicy food.  He has increased burping and flatulence which improves his symptoms.  Feel that the patient is stable for discharge at this time and he is agreeable.  Will discharge on a course of omeprazole.  Recommended PCP as well as cardiology follow-up.  He verbalized understanding.  Discussed return precautions.  His questions were answered and he was amicable at the time of discharge.  Note: Portions of this report may have been transcribed using voice recognition software. Every effort was made to ensure accuracy; however, inadvertent computerized transcription errors may be present.   Final Clinical Impression(s) / ED Diagnoses Final diagnoses:  Chest pain, unspecified type   Rx / DC Orders ED Discharge Orders          Ordered    omeprazole (PRILOSEC) 20 MG capsule  Daily        12/31/21 0534              Rayna Sexton, PA-C 12/31/21 0546    Merryl Hacker, MD 12/31/21 [phone]

## 2021-12-31 NOTE — ED Notes (Signed)
Pt refused d/c vitals.

## 2022-01-09 DIAGNOSIS — D235 Other benign neoplasm of skin of trunk: Secondary | ICD-10-CM | POA: Diagnosis not present

## 2022-01-09 DIAGNOSIS — L814 Other melanin hyperpigmentation: Secondary | ICD-10-CM | POA: Diagnosis not present

## 2022-01-09 DIAGNOSIS — D2271 Melanocytic nevi of right lower limb, including hip: Secondary | ICD-10-CM | POA: Diagnosis not present

## 2022-01-09 DIAGNOSIS — D2371 Other benign neoplasm of skin of right lower limb, including hip: Secondary | ICD-10-CM | POA: Diagnosis not present

## 2022-01-09 DIAGNOSIS — L80 Vitiligo: Secondary | ICD-10-CM | POA: Diagnosis not present

## 2022-01-09 DIAGNOSIS — L821 Other seborrheic keratosis: Secondary | ICD-10-CM | POA: Diagnosis not present

## 2022-01-12 NOTE — Progress Notes (Signed)
Cardiology Office Note:    Date:  01/15/2022   ID:  Dustin Mcclure, DOB Jul 03, 1941, MRN [id number]  PCP:  Lawerance Cruel, MD  Cardiologist:  Donato Heinz, MD  Electrophysiologist:  None   Referring MD: Isla Pence, MD   Chief Complaint  Patient presents with   Coronary Artery Disease    History of Present Illness:    Dustin Mcclure is a 81 y.o. male with a hx of CAD status post LAD angioplasty in 1990, hypertension, hyperlipidemia who presents as an ED follow-up for chest pain.  He was seen in the ED on 12/26/2021 and again on 12/31/2021 with chest pain.  Work-up unremarkable.  He describes that he was having pressure in the center of his chest.  Did not radiate.  Reports he walks on the treadmill for 15 minutes, denies any exertional symptoms.  He has not had any chest pain since discharge from the ED.  He denies any lightheadedness, syncope, lower extremity edema, palpitations.  He smoked for 6 to 7 years in his 23s, less than 1 pack/day, quit age 58.  Family history includes brother had multiple MIs, first in 29s and has pacemaker.  Reports BP has been 120s over 60s when checks at home.    Past Medical History:  Diagnosis Date   CAD (coronary artery disease)    HTN (hypertension)    Hypercholesteremia     Past Surgical History:  Procedure Laterality Date   CORONARY ANGIOPLASTY  1990   mid-LAD    Current Medications: Current Meds  Medication Sig   aspirin EC 81 MG tablet Take 81 mg by mouth daily. Swallow whole.   famotidine (PEPCID) 20 MG tablet Take 20 mg by mouth daily as needed.   finasteride (PROSCAR) 5 MG tablet Take 5 mg by mouth daily.   metoprolol succinate (TOPROL-XL) 50 MG 24 hr tablet Take 50 mg by mouth in the morning.   Saccharomyces boulardii (PROBIOTIC) 250 MG CAPS Take 250 mg by mouth 3 (three) times a week.   [DISCONTINUED] atorvastatin (LIPITOR) 40 MG tablet Take 40 mg by mouth See admin instructions. Take 40 mg by mouth every other NIGHT      Allergies:   Patient has no known allergies.   Social History   Socioeconomic History   Marital status: Married    Spouse name: Not on file   Number of children: Not on file   Years of education: Not on file   Highest education level: Not on file  Occupational History   Not on file  Tobacco Use   Smoking status: Former    Types: Cigarettes    Quit date: 49    Years since quitting: 50.1   Smokeless tobacco: Never  Substance and Sexual Activity   Alcohol use: Not on file   Drug use: Not on file   Sexual activity: Not on file  Other Topics Concern   Not on file  Social History Narrative   Not on file   Social Determinants of Health   Financial Resource Strain: Not on file  Food Insecurity: Not on file  Transportation Needs: Not on file  Physical Activity: Not on file  Stress: Not on file  Social Connections: Not on file     Family History: The patient's family history is not on file.  ROS:   Please see the history of present illness.     All other systems reviewed and are negative.  EKGs/Labs/Other Studies Reviewed:    The following  studies were reviewed today:   EKG:   01/15/22: Normal sinus rhythm, rate 62, T wave inversions in leads aVL, V2/3  Recent Labs: 12/30/2021: ALT 17; BUN 10; Creatinine, Ser 0.72; Hemoglobin 13.5; Platelets 199; Potassium 3.9; Sodium 132  Recent Lipid Panel No results found for: CHOL, TRIG, HDL, CHOLHDL, VLDL, LDLCALC, LDLDIRECT  Physical Exam:    VS:  BP (!) 160/74    Pulse 62    Ht 5\' 8"  (1.727 m)    Wt 180 lb 3.2 oz (81.7 kg)    SpO2 98%    BMI 27.40 kg/m     Wt Readings from Last 3 Encounters:  01/15/22 180 lb 3.2 oz (81.7 kg)  12/26/21 183 lb (83 kg)     GEN:  Well nourished, well developed in no acute distress HEENT: Normal NECK: No JVD; No carotid bruits LYMPHATICS: No lymphadenopathy CARDIAC: RRR, no murmurs, rubs, gallops RESPIRATORY:  Clear to auscultation without rales, wheezing or rhonchi  ABDOMEN:  Soft, non-tender, non-distended MUSCULOSKELETAL:  No edema; No deformity  SKIN: Warm and dry NEUROLOGIC:  Alert and oriented x 3 PSYCHIATRIC:  Normal affect   ASSESSMENT:    1. Chest pain, unspecified type   2. Coronary artery disease involving native coronary artery of native heart, unspecified whether angina present   3. Hyperlipidemia, unspecified hyperlipidemia type   4. Essential hypertension    PLAN:    Chest pain: Atypical in description but given his CAD history with MI status post angioplasty 1990, warrants evaluation to rule out ischemia -Treadmill Myoview -Echocardiogram  CAD: Status post angioplasty 1990 to mid LAD.  On aspirin, atorvastatin, metoprolol  Hyperlipidemia: On atorvastatin 40 mg every other day.  LDL 96 on 12/10/2021.  Will increase atorvastatin to 40 mg daily for goal LDL less than 70  Hypertension: On Toprol-XL 50 mg daily.  BP elevated in clinic today reports has been under good control at home.  Asked to check BP twice daily for 1 week and call with results  RTC in 3 months  Shared Decision Making/Informed Consent The risks [chest pain, shortness of breath, cardiac arrhythmias, dizziness, blood pressure fluctuations, myocardial infarction, stroke/transient ischemic attack, nausea, vomiting, allergic reaction, radiation exposure, metallic taste sensation and life-threatening complications (estimated to be 1 in 10,000)], benefits (risk stratification, diagnosing coronary artery disease, treatment guidance) and alternatives of a nuclear stress test were discussed in detail with Dustin Mcclure and he agrees to proceed.    Medication Adjustments/Labs and Tests Ordered: Current medicines are reviewed at length with the patient today.  Concerns regarding medicines are outlined above.  Orders Placed This Encounter  Procedures   MYOCARDIAL PERFUSION IMAGING   EKG 12-Lead   ECHOCARDIOGRAM COMPLETE   Meds ordered this encounter  Medications   atorvastatin  (LIPITOR) 40 MG tablet    Sig: Take 1 tablet (40 mg total) by mouth daily.    Dispense:  90 tablet    Refill:  1    Patient Instructions  Medication Instructions:  INCREASE the Atorvastatin to 40 mg once daily  *If you need a refill on your cardiac medications before your next appointment, please call your pharmacy*   Lab Work: None ordered If you have labs (blood work) drawn today and your tests are completely normal, you will receive your results only by: [place] (if you have MyChart) OR A paper copy in the mail If you have any lab test that is abnormal or we need to change your treatment, we will call you to  review the results.   Testing/Procedures: Your physician has requested that you have an echocardiogram. Echocardiography is a painless test that uses sound waves to create images of your heart. It provides your doctor with information about the size and shape of your heart and how well your hearts chambers and valves are working. You may receive an ultrasound enhancing agent through an IV if needed to better visualize your heart during the echo.This procedure takes approximately one hour. There are no restrictions for this procedure. This will take place at the [address]   Your physician has requested that you have an exercise stress myoview. For further information please visit [email or web address]. Please follow instruction sheet, as given. This will take place at the [address].   How to prepare for your Myocardial Perfusion Test: Do not eat or drink 3 hours prior to your test, except you may have water. Do not consume products containing caffeine (regular or decaffeinated) 12 hours prior to your test. (ex: coffee, chocolate, sodas, tea). Do bring a list of your current medications with you.  If not listed below, you may take your medications as normal. Do wear comfortable clothes (no dresses or overalls) and walking shoes, tennis  shoes preferred (No heels or open toe shoes are allowed). Do NOT wear cologne, perfume, aftershave, or lotions (deodorant is allowed). The test will take approximately 3 to 4 hours to complete If these instructions are not followed, your test will have to be rescheduled. Hold the metoprolol the morning of the test   Follow-Up: At [facility], you and your health needs are our priority.  As part of our continuing mission to provide you with exceptional heart care, we have created designated Provider Care Teams.  These Care Teams include your primary Cardiologist (physician) and Advanced Practice Providers (APPs -  Physician Assistants and Nurse Practitioners) who all work together to provide you with the care you need, when you need it.  We recommend signing up for the patient portal called "MyChart".  Sign up information is provided on this After Visit Summary.  MyChart is used to connect with patients for Virtual Visits (Telemedicine).  Patients are able to view lab/test results, encounter notes, upcoming appointments, etc.  Non-urgent messages can be sent to your provider as well.   To learn more about what you can do with MyChart, go to [email or web address].    Your next appointment:   3 month(s)  The format for your next appointment:   In Person  Provider:   Donato Heinz, MD     Other Instructions Dr. Gardiner Rhyme would like you to check your blood pressure twice daily for the next week.  Keep a journal of these daily blood pressure and heart rate readings and call our office or send a message through [facility] with the results. Thank you!  It is best to check your BP 1-2 hours after taking your medications to see the medications effectiveness on your BP.    Here are some tips that our clinical pharmacists share for home BP monitoring:          Rest 10 minutes before taking your blood pressure.          Don't smoke or drink caffeinated beverages for at least 30 minutes  before.          Take your blood pressure before (not after) you eat.          Sit comfortably with  your back supported and both feet on the floor (don't cross your legs).          Elevate your arm to heart level on a table or a desk.          Use the proper sized cuff. It should fit smoothly and snugly around your bare upper arm. There should be enough room to slip a fingertip under the cuff. The bottom edge of the cuff should be 1 inch above the crease of the elbow.     Signed, Donato Heinz, MD  01/15/2022 4:07 PM    [facility]

## 2022-01-15 ENCOUNTER — Other Ambulatory Visit: Payer: Self-pay

## 2022-01-15 ENCOUNTER — Ambulatory Visit: Payer: Medicare Other | Admitting: Cardiology

## 2022-01-15 ENCOUNTER — Encounter: Payer: Self-pay | Admitting: Cardiology

## 2022-01-15 VITALS — BP 160/74 | HR 62 | Ht 68.0 in | Wt 180.2 lb

## 2022-01-15 DIAGNOSIS — R079 Chest pain, unspecified: Secondary | ICD-10-CM | POA: Diagnosis not present

## 2022-01-15 DIAGNOSIS — E785 Hyperlipidemia, unspecified: Secondary | ICD-10-CM | POA: Diagnosis not present

## 2022-01-15 DIAGNOSIS — I2510 Atherosclerotic heart disease of native coronary artery without angina pectoris: Secondary | ICD-10-CM

## 2022-01-15 DIAGNOSIS — I10 Essential (primary) hypertension: Secondary | ICD-10-CM | POA: Diagnosis not present

## 2022-01-15 MED ORDER — ATORVASTATIN CALCIUM 40 MG PO TABS: 40.0000 mg | ORAL_TABLET | Freq: Every day | ORAL | 1 refills | Status: DC

## 2022-01-15 NOTE — Patient Instructions (Addendum)
Medication Instructions:  INCREASE the Atorvastatin to 40 mg once daily  *If you need a refill on your cardiac medications before your next appointment, please call your pharmacy*   Lab Work: None ordered If you have labs (blood work) drawn today and your tests are completely normal, you will receive your results only by: [place] (if you have MyChart) OR A paper copy in the mail If you have any lab test that is abnormal or we need to change your treatment, we will call you to review the results.   Testing/Procedures: Your physician has requested that you have an echocardiogram. Echocardiography is a painless test that uses sound waves to create images of your heart. It provides your doctor with information about the size and shape of your heart and how well your hearts chambers and valves are working. You may receive an ultrasound enhancing agent through an IV if needed to better visualize your heart during the echo.This procedure takes approximately one hour. There are no restrictions for this procedure. This will take place at the [address]   Your physician has requested that you have an exercise stress myoview. For further information please visit [email or web address]. Please follow instruction sheet, as given. This will take place at the [address].   How to prepare for your Myocardial Perfusion Test: Do not eat or drink 3 hours prior to your test, except you may have water. Do not consume products containing caffeine (regular or decaffeinated) 12 hours prior to your test. (ex: coffee, chocolate, sodas, tea). Do bring a list of your current medications with you.  If not listed below, you may take your medications as normal. Do wear comfortable clothes (no dresses or overalls) and walking shoes, tennis shoes preferred (No heels or open toe shoes are allowed). Do NOT wear cologne, perfume, aftershave, or lotions (deodorant is allowed). The test  will take approximately 3 to 4 hours to complete If these instructions are not followed, your test will have to be rescheduled. Hold the metoprolol the morning of the test   Follow-Up: At [facility], you and your health needs are our priority.  As part of our continuing mission to provide you with exceptional heart care, we have created designated Provider Care Teams.  These Care Teams include your primary Cardiologist (physician) and Advanced Practice Providers (APPs -  Physician Assistants and Nurse Practitioners) who all work together to provide you with the care you need, when you need it.  We recommend signing up for the patient portal called "MyChart".  Sign up information is provided on this After Visit Summary.  MyChart is used to connect with patients for Virtual Visits (Telemedicine).  Patients are able to view lab/test results, encounter notes, upcoming appointments, etc.  Non-urgent messages can be sent to your provider as well.   To learn more about what you can do with MyChart, go to [email or web address].    Your next appointment:   3 month(s)  The format for your next appointment:   In Person  Provider:   Donato Heinz, MD     Other Instructions Dr. Gardiner Rhyme would like you to check your blood pressure twice daily for the next week.  Keep a journal of these daily blood pressure and heart rate readings and call our office or send a message through [place] with the results. Thank you!  It is best to check your BP 1-2 hours after taking your medications to see the  medications effectiveness on your BP.    Here are some tips that our clinical pharmacists share for home BP monitoring:          Rest 10 minutes before taking your blood pressure.          Don't smoke or drink caffeinated beverages for at least 30 minutes before.          Take your blood pressure before (not after) you eat.          Sit comfortably with your back supported and both feet on the  floor (don't cross your legs).          Elevate your arm to heart level on a table or a desk.          Use the proper sized cuff. It should fit smoothly and snugly around your bare upper arm. There should be enough room to slip a fingertip under the cuff. The bottom edge of the cuff should be 1 inch above the crease of the elbow.

## 2022-01-30 ENCOUNTER — Encounter: Payer: Self-pay | Admitting: Cardiology

## 2022-02-04 ENCOUNTER — Telehealth (HOSPITAL_COMMUNITY): Payer: Self-pay | Admitting: *Deleted

## 2022-02-04 NOTE — Telephone Encounter (Signed)
Patient given detailed instructions per Myocardial Perfusion Study Information Sheet for the test on 02/11/22 at 1045. Patient notified to arrive 15 minutes early and that it is imperative to arrive on time for appointment to keep from having the test rescheduled. ? If you need to cancel or reschedule your appointment, please call the office within 24 hours of your appointment. . Patient verbalized understanding.Dustin Mcclure, [name] ? ? ?

## 2022-02-11 ENCOUNTER — Ambulatory Visit (HOSPITAL_COMMUNITY): Payer: Medicare Other | Attending: Internal Medicine

## 2022-02-11 ENCOUNTER — Other Ambulatory Visit: Payer: Self-pay

## 2022-02-11 ENCOUNTER — Ambulatory Visit (HOSPITAL_BASED_OUTPATIENT_CLINIC_OR_DEPARTMENT_OTHER): Payer: Medicare Other

## 2022-02-11 DIAGNOSIS — R079 Chest pain, unspecified: Secondary | ICD-10-CM | POA: Insufficient documentation

## 2022-02-11 LAB — MYOCARDIAL PERFUSION IMAGING
LV dias vol: 79 mL (ref 62–150)
LV sys vol: 36 mL
Nuc Stress EF: 55 %
Peak HR: 112 {beats}/min
Rest HR: 71 {beats}/min
Rest Nuclear Isotope Dose: 10.8 mCi
SDS: 3
SRS: 3
SSS: 6
Stress Nuclear Isotope Dose: 32.6 mCi
TID: 0.96

## 2022-02-11 LAB — ECHOCARDIOGRAM COMPLETE
Area-P 1/2: 3.7 cm2
S' Lateral: 2.1 cm

## 2022-02-11 MED ORDER — TECHNETIUM TC 99M TETROFOSMIN IV KIT
32.6000 | PACK | Freq: Once | INTRAVENOUS | Status: AC | PRN
  Administered 2022-02-11: 32.6 via INTRAVENOUS
  Filled 2022-02-11: qty 33

## 2022-02-11 MED ORDER — TECHNETIUM TC 99M TETROFOSMIN IV KIT
10.8000 | PACK | Freq: Once | INTRAVENOUS | Status: AC | PRN
  Administered 2022-02-11: 10.8 via INTRAVENOUS
  Filled 2022-02-11: qty 11

## 2022-02-12 ENCOUNTER — Encounter: Payer: Self-pay | Admitting: Cardiology

## 2022-02-25 ENCOUNTER — Encounter: Payer: Self-pay | Admitting: Cardiology

## 2022-04-27 ENCOUNTER — Encounter: Payer: Self-pay | Admitting: Cardiology

## 2022-05-25 DIAGNOSIS — K2100 Gastro-esophageal reflux disease with esophagitis, without bleeding: Secondary | ICD-10-CM | POA: Diagnosis not present

## 2022-05-25 DIAGNOSIS — B9681 Helicobacter pylori [H. pylori] as the cause of diseases classified elsewhere: Secondary | ICD-10-CM | POA: Diagnosis not present

## 2022-09-03 ENCOUNTER — Other Ambulatory Visit: Payer: Self-pay | Admitting: Cardiology

## 2022-09-09 ENCOUNTER — Other Ambulatory Visit: Payer: Self-pay | Admitting: Cardiology

## 2022-12-18 DIAGNOSIS — E782 Mixed hyperlipidemia: Secondary | ICD-10-CM | POA: Diagnosis not present

## 2022-12-18 DIAGNOSIS — I10 Essential (primary) hypertension: Secondary | ICD-10-CM | POA: Diagnosis not present

## 2022-12-24 DIAGNOSIS — Z0000 Encounter for general adult medical examination without abnormal findings: Secondary | ICD-10-CM | POA: Diagnosis not present

## 2022-12-24 DIAGNOSIS — I10 Essential (primary) hypertension: Secondary | ICD-10-CM | POA: Diagnosis not present

## 2023-01-04 DIAGNOSIS — I25118 Atherosclerotic heart disease of native coronary artery with other forms of angina pectoris: Secondary | ICD-10-CM | POA: Diagnosis not present

## 2023-01-04 DIAGNOSIS — R072 Precordial pain: Secondary | ICD-10-CM | POA: Diagnosis not present

## 2023-01-05 ENCOUNTER — Encounter: Payer: Self-pay | Admitting: Cardiology

## 2023-01-06 ENCOUNTER — Encounter: Payer: Self-pay | Admitting: Cardiology

## 2023-01-13 IMAGING — DX DG HIP (WITH OR WITHOUT PELVIS) 2-3V*L*
2 series · 2 of 2 positions shown · non-contrast
Comparison: None.

CLINICAL DATA: LEFT HIP PAIN

EXAM:
DG HIP (WITH OR WITHOUT PELVIS) 2-3V LEFT

[dg hip unilat w or w/o pelvis 2-3 views  (1 of 2)]
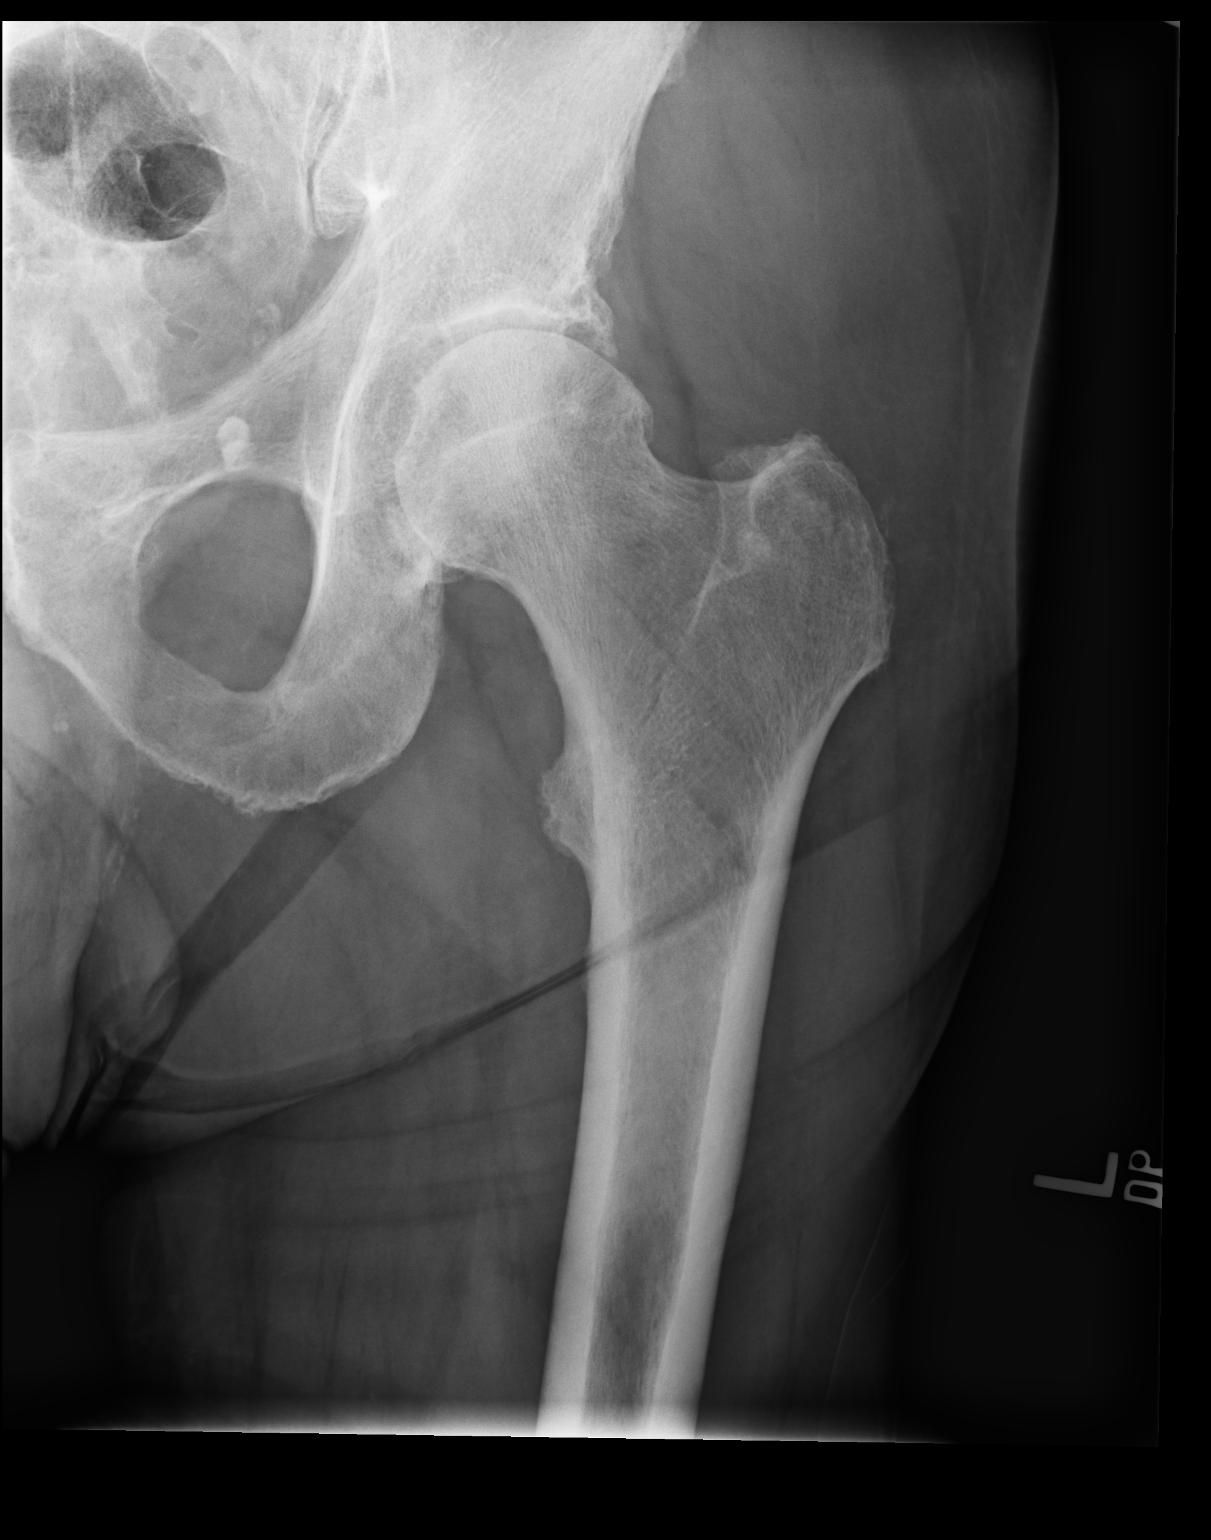

[dg hip unilat w or w/o pelvis 2-3 views  (2 of 2)]
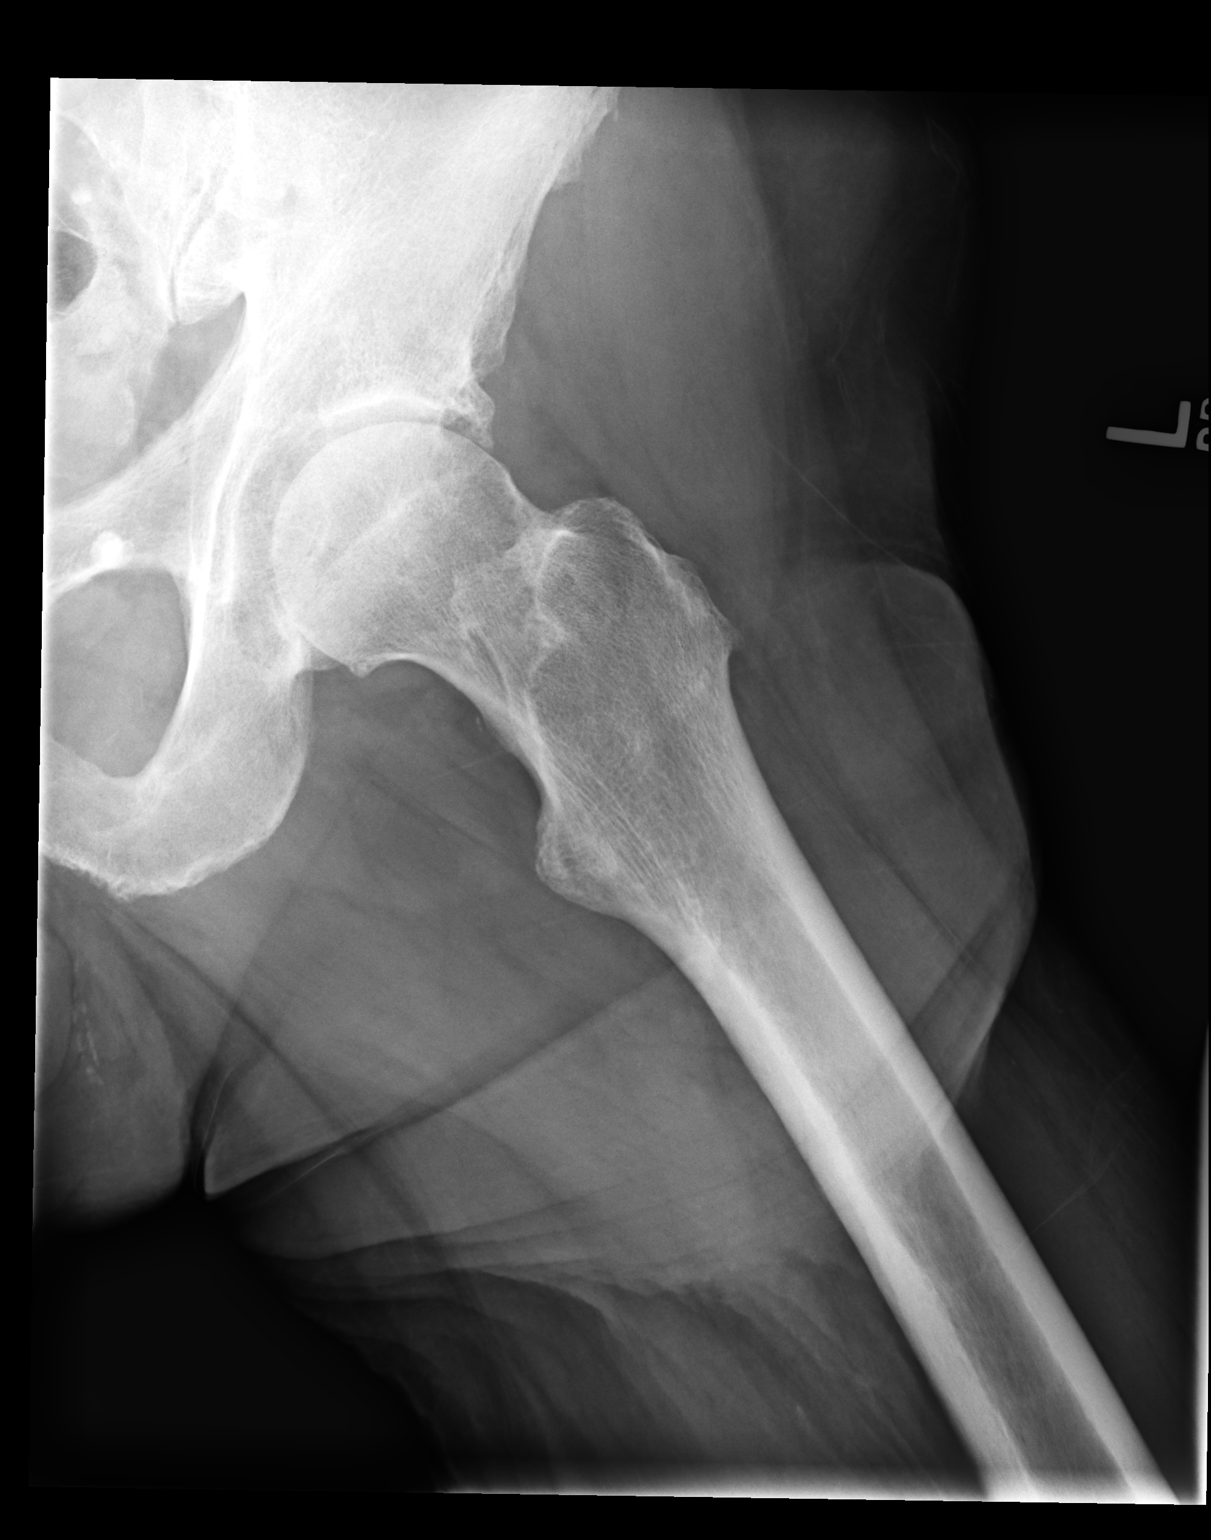

[2 of 2 positions shown; findings below may reference images not displayed]

FINDINGS: No acute fracture or dislocation. Moderate degenerative changes of
the LEFT hip with osteophyte formation and joint space narrowing.
Subcortical cyst formation. No area of erosion or osseous
destruction. No unexpected radiopaque foreign body. Pelvic
phleboliths. Atherosclerotic calcifications.
IMPRESSION: Moderate degenerative changes.

## 2023-01-13 IMAGING — DX DG LUMBAR SPINE 2-3V
3 series · 3 of 3 positions shown · non-contrast
Comparison: None.

CLINICAL DATA: BACK PAIN

EXAM:
LUMBAR SPINE - 2-3 VIEW

[dg lumbar spine 2-3 views (1 of 3)]
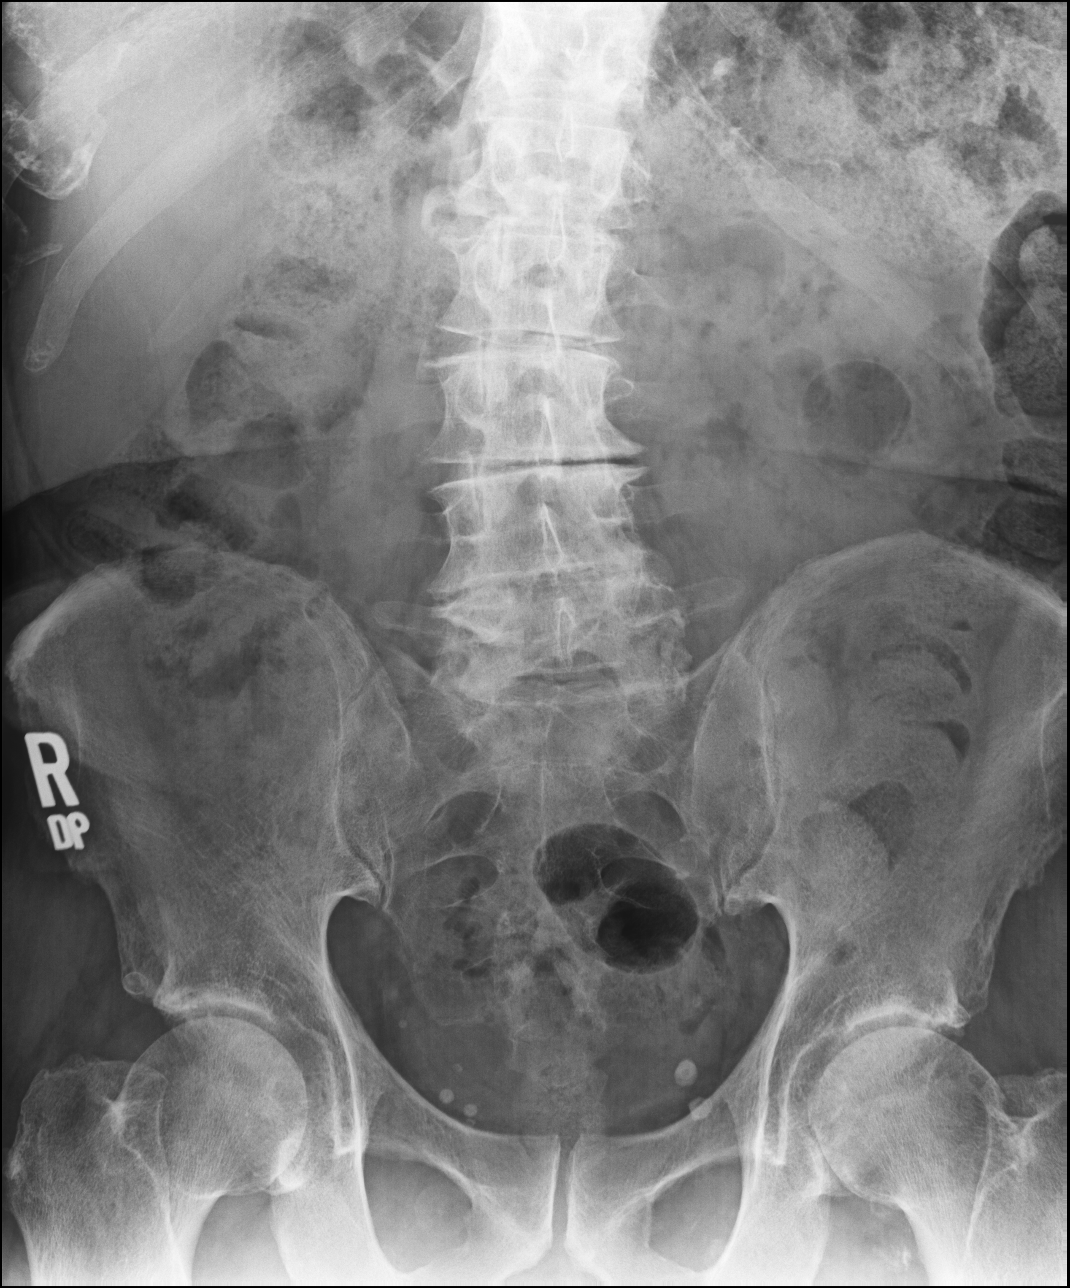

[dg lumbar spine 2-3 views (2 of 3)]
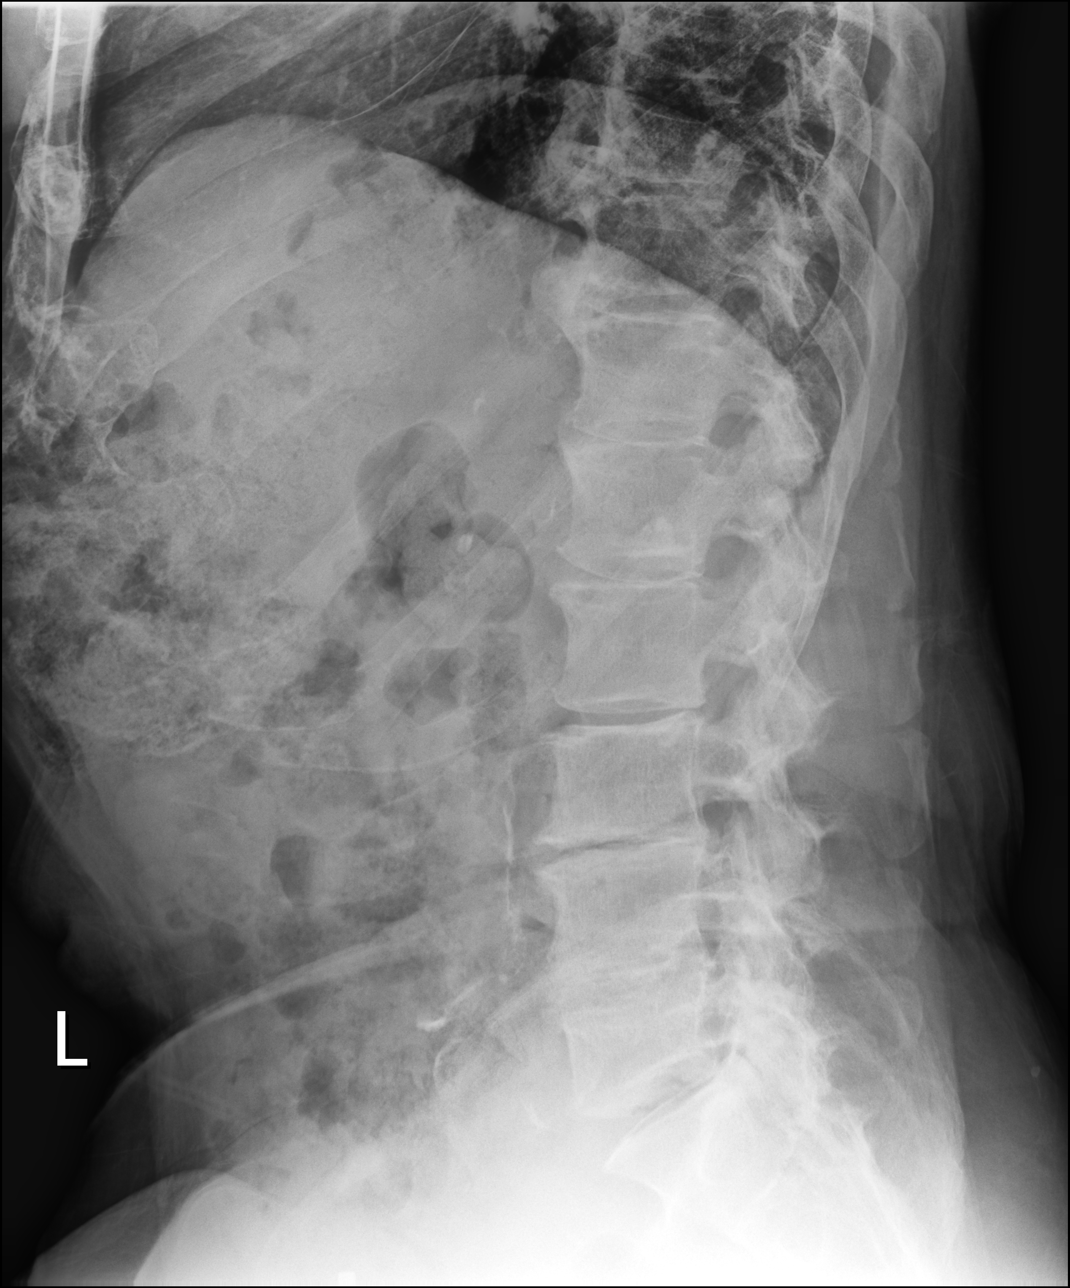

[dg lumbar spine 2-3 views (3 of 3)]
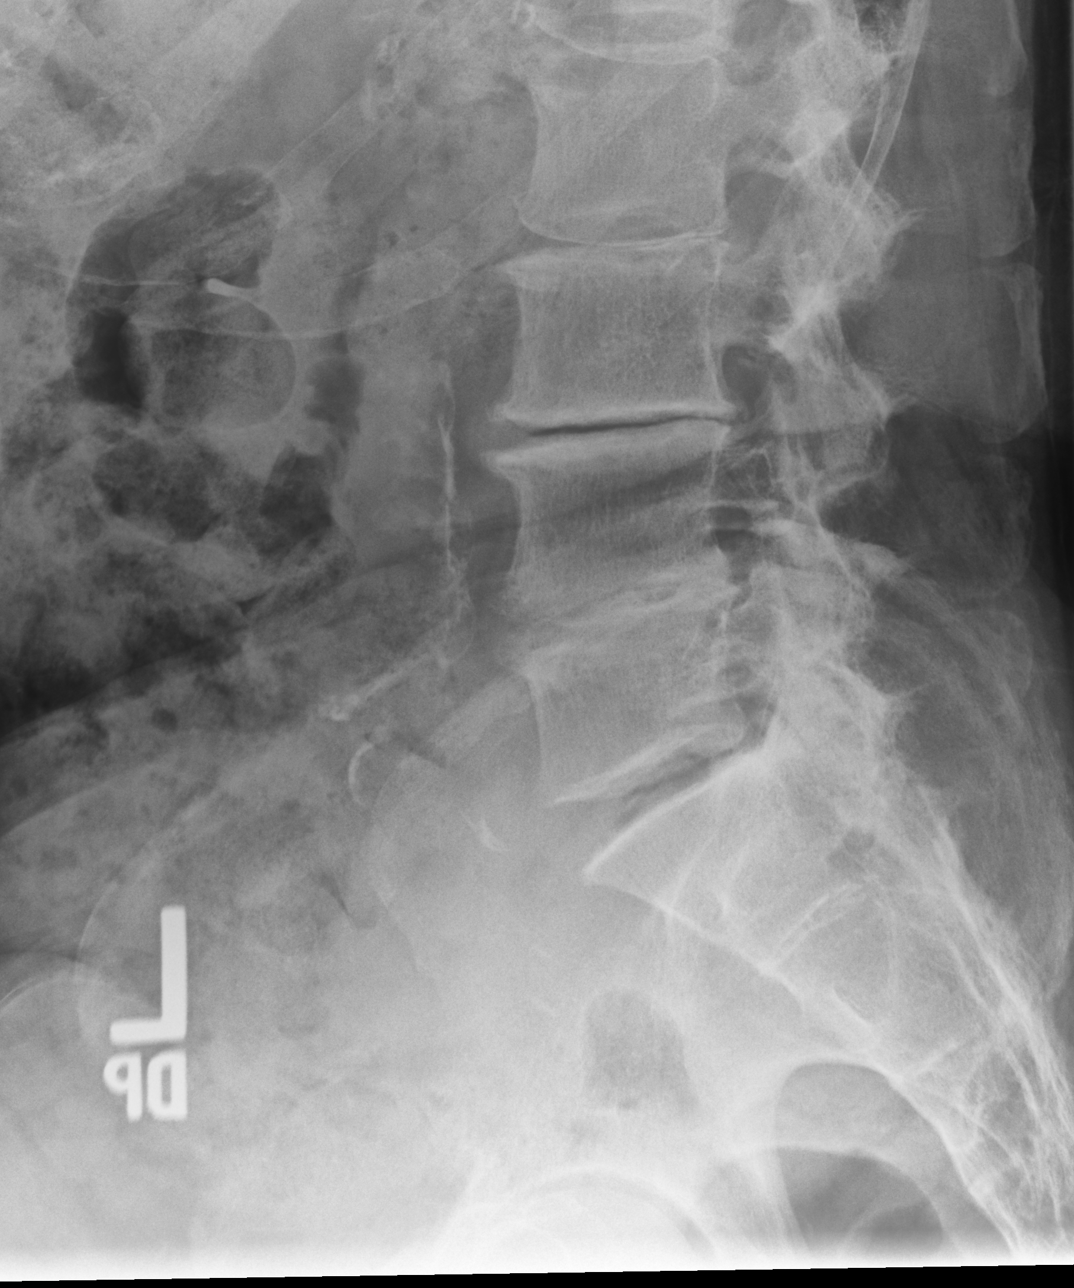

[3 of 3 positions shown; findings below may reference images not displayed]

FINDINGS: There are five non-rib bearing lumbar-type vertebral bodies. There
is dextrocurvature of the lumbar spine. No significant
spondylolisthesis. Straightening of the lumbar spine. There is no
evidence for acute fracture or subluxation. Severe intervertebral
disc space height loss of L3-4 and L4-5. Moderate intervertebral
disc space height loss at L2-3 and L1-2. Mild intervertebral disc
space height loss at L5-S1. Multilevel endplate proliferative
changes and facet arthropathy. Vascular calcifications. Pelvic
phleboliths.
IMPRESSION: Multilevel degenerative changes with dextrocurvature of the lumbar
spine. Degenerative changes are most severe at L3-4 and L4-5.

## 2023-01-19 NOTE — Progress Notes (Unsigned)
Cardiology Office Note:    Date:  01/20/2023   ID:  Dustin Mcclure, DOB March 21, 1941, MRN [id number]  PCP:  Lawerance Cruel, MD  Cardiologist:  Donato Heinz, MD  Electrophysiologist:  None   Referring MD: Lawerance Cruel, MD   Chief Complaint  Patient presents with   Palpitations    History of Present Illness:    Dustin Mcclure is a 82 y.o. male with a hx of CAD status post LAD angioplasty in 1990, hypertension, hyperlipidemia who presents for follow-up.  He was seen in the ED on 12/26/2021 and again on 12/31/2021 with chest pain.  Work-up unremarkable.  He describes that he was having pressure in the center of his chest.  Did not radiate.  Reports he walks on the treadmill for 15 minutes, denies any exertional symptoms.  He has not had any chest pain since discharge from the ED.  He denies any lightheadedness, syncope, lower extremity edema, palpitations.  He smoked for 6 to 7 years in his 20s, less than 1 pack/day, quit age 66.  Family history includes brother had multiple MIs, first in 49s and has pacemaker.  Reports BP has been 120s over 60s when checks at home.  Echocardiogram 02/11/2022 showed EF 65 to [id number], grade 1 diastolic dysfunction, normal RV function, no significant valvular disease.  Lexiscan Myoview on 02/11/2022 showed small fixed apical lesion (suspect artifact), EF 55%.  Since last clinic visit, he reports he has been doing okay.  On 01/04/2023 he had an urgent care visit for palpitations.  States that he had drank a second cup of coffee that morning and started to walk up the stairs and felt like his heart was racing and going irregularly.  He tried to take his blood pressure but monitor said pulse was irregular.  He went to urgent care, reports symptoms resolved before he could be seen.  EKG in urgent care showed sinus rhythm.  Reports symptoms lasted about 45 minutes.  He has not had palpitations since that time.  He denies any chest pain or dyspnea.  He walks for 10  to 15 minutes on treadmill daily, denies exertional symptoms.  Past Medical History:  Diagnosis Date   CAD (coronary artery disease)    HTN (hypertension)    Hypercholesteremia     Past Surgical History:  Procedure Laterality Date   CORONARY ANGIOPLASTY  1990   mid-LAD    Current Medications: Current Meds  Medication Sig   aspirin EC 81 MG tablet Take 81 mg by mouth daily. Swallow whole.   atorvastatin (LIPITOR) 40 MG tablet Take 1 tablet (40 mg total) by mouth daily. Please schedule appointment for further refills   ezetimibe (ZETIA) 10 MG tablet Take 1 tablet (10 mg total) by mouth daily.   famotidine (PEPCID) 20 MG tablet Take 20 mg by mouth daily as needed.   finasteride (PROSCAR) 5 MG tablet Take 5 mg by mouth daily.   metoprolol succinate (TOPROL-XL) 50 MG 24 hr tablet Take 50 mg by mouth in the morning.   Saccharomyces boulardii (PROBIOTIC) 250 MG CAPS Take 250 mg by mouth 3 (three) times a week.   vardenafil (LEVITRA) 20 MG tablet Take 10-20 mg by mouth daily as needed (as directed).     Allergies:   Patient has no known allergies.   Social History   Socioeconomic History   Marital status: Married    Spouse name: Not on file   Number of children: Not on file   Years of  education: Not on file   Highest education level: Not on file  Occupational History   Not on file  Tobacco Use   Smoking status: Former    Types: Cigarettes    Quit date: 16    Years since quitting: 51.1   Smokeless tobacco: Never  Substance and Sexual Activity   Alcohol use: Not on file   Drug use: Not on file   Sexual activity: Not on file  Other Topics Concern   Not on file  Social History Narrative   Not on file   Social Determinants of Health   Financial Resource Strain: Not on file  Food Insecurity: Not on file  Transportation Needs: Not on file  Physical Activity: Not on file  Stress: Not on file  Social Connections: Not on file     Family History: The patient's family  history is not on file.  ROS:   Please see the history of present illness.     All other systems reviewed and are negative.  EKGs/Labs/Other Studies Reviewed:    The following studies were reviewed today:   EKG:   01/15/22: Normal sinus rhythm, rate 62, T wave inversions in leads aVL, V2/3 01/20/23: NSR, rate 57  Recent Labs: No results found for requested labs within last 365 days.  Recent Lipid Panel No results found for: "CHOL", "TRIG", "HDL", "CHOLHDL", "VLDL", "LDLCALC", "LDLDIRECT"  Physical Exam:    VS:  BP (!) 164/84   Pulse (!) 57   Ht '5\' 8"'$  (1.727 m)   Wt 173 lb 3.2 oz (78.6 kg)   BMI 26.33 kg/m     Wt Readings from Last 3 Encounters:  01/20/23 173 lb 3.2 oz (78.6 kg)  02/11/22 180 lb (81.6 kg)  01/15/22 180 lb 3.2 oz (81.7 kg)     GEN:  Well nourished, well developed in no acute distress HEENT: Normal NECK: No JVD; No carotid bruits LYMPHATICS: No lymphadenopathy CARDIAC: RRR, no murmurs, rubs, gallops RESPIRATORY:  Clear to auscultation without rales, wheezing or rhonchi  ABDOMEN: Soft, non-tender, non-distended MUSCULOSKELETAL:  trace edema; No deformity  SKIN: Warm and dry NEUROLOGIC:  Alert and oriented x 3 PSYCHIATRIC:  Normal affect   ASSESSMENT:    1. CAD in native artery   2. Hyperlipidemia, unspecified hyperlipidemia type   3. Palpitations   4. Essential hypertension    PLAN:    Palpitations: Description concerning for arrhythmia, will evaluate with Zio patch x 2 weeks  Chest pain: Atypical in description.  Echocardiogram 02/11/2022 showed EF 65 to [id number], grade 1 diastolic dysfunction, normal RV function, no significant valvular disease.  Lexiscan Myoview on 02/11/2022 showed small fixed apical lesion (suspect artifact), EF 55%.  CAD: Status post angioplasty 1990 to mid LAD.  On aspirin, atorvastatin, metoprolol  Hyperlipidemia: On atorvastatin 40 mg every other day.  LDL 96 on 12/10/2021.  Increased atorvastatin to 40 mg daily for goal LDL  less than 70.  LDL 84 on 12/18/2022.  Recommend adding Zetia 10 mg daily and repeat Pete fasting lipid panel in 2 months  Hypertension: On Toprol-XL 50 mg daily.  BP 164/84 in clinic today initially, was 142/70 on repeat.  Reports history of whitecoat hypertension.  Asked to check BP twice daily for next week and call with results.  RTC in 3 months   Medication Adjustments/Labs and Tests Ordered: Current medicines are reviewed at length with the patient today.  Concerns regarding medicines are outlined above.  Orders Placed This Encounter  Procedures  Lipid panel   LONG TERM MONITOR (3-14 DAYS)   EKG 12-Lead   Meds ordered this encounter  Medications   ezetimibe (ZETIA) 10 MG tablet    Sig: Take 1 tablet (10 mg total) by mouth daily.    Dispense:  90 tablet    Refill:  3    Patient Instructions  Medication Instructions:  START Zetia 10 mg daily  *If you need a refill on your cardiac medications before your next appointment, please call your pharmacy*   Lab Work: Please return for FASTING labs in 2 months (Lipid)  Our in office lab hours are Monday-Friday 8:00-4:00, closed for lunch 12:45-1:45 pm.  No appointment needed.  [facility] locations:   [place] ([place]) - [id number] [place] [address] [place] [address] - [address] [place] - [address] ([facility])  Testing/Procedures: [place] Monitor Instructions   Your physician has requested you wear a ZIO patch monitor for _14_ days.  This is a single patch monitor.   [employer] supplies one patch monitor per enrollment. Additional stickers are not available. Please do not apply patch if you will be having a Nuclear Stress Test,  Echocardiogram, Cardiac CT, MRI, or Chest Xray during the period you would be wearing the monitor. The patch cannot be worn during these tests. You cannot remove and re-apply the ZIO XT patch monitor.  Your ZIO patch monitor will be sent Fed Ex from [employer] directly to your home address. It may take 3-5 days to receive your monitor after you have been enrolled.  Once you have received your monitor, please review the enclosed instructions. Your monitor has already been registered assigning a specific monitor serial # to you.  Billing and Patient Assistance Program Information   We have supplied [employer] with any of your insurance information on file for billing purposes. [employer] offers a sliding scale Patient Assistance Program for patients that do not have insurance, or whose insurance does not completely cover the cost of the ZIO monitor.   You must apply for the Patient Assistance Program to qualify for this discounted rate.     To apply, please call [employer] at [phone], select option 4, then select option 2, and ask to apply for Patient Assistance Program.  Theodore Demark will ask your household income, and how many people are in your household.  They will quote your out-of-pocket cost based on that information.  [employer] will also be able to set up a 27-month interest-free payment plan if needed.  Applying the monitor   Shave hair from upper left chest.  Hold abrader disc by orange tab. Rub abrader in 40 strokes over the upper left chest as indicated in your monitor instructions.  Clean area with 4 enclosed alcohol pads. Let dry.  Apply patch as indicated in monitor instructions. Patch will be placed under collarbone on left side of chest with arrow pointing upward.  Rub patch adhesive wings for 2 minutes. Remove white label marked "1". Remove the white label marked "2". Rub patch adhesive wings for 2 additional minutes.  While looking in a mirror, press and release button in center of  patch. A small green light will flash 3-4 times. This will be your only indicator that the monitor has been turned on. ?  Do not shower for the first 24 hours. You may shower after the first 24 hours.  Press the button if you feel a symptom. You will hear a small click. Record Date, Time and Symptom in the Patient Logbook.  When you are ready to remove the patch, follow instructions on the last 2 pages of the Patient Logbook. Stick patch monitor onto the last page of Patient Logbook.  Place Patient Logbook in the blue and white box.  Use locking tab on box and tape box closed securely.  The blue and white box has prepaid postage on it. Please place it in the mailbox as soon as possible. Your physician should have your test results approximately 7 days after the monitor has been mailed back to [facility].  Call [place] at [phone] if you have questions regarding your ZIO XT patch monitor. Call them immediately if you see an orange light blinking on your monitor.  If your monitor falls off in less than 4 days, contact our Monitor department at [phone]. ?If your monitor becomes loose or falls off after 4 days call [employer] at [phone] for suggestions on securing your monitor.?  Follow-Up: At [facility], you and your health needs are our priority.  As part of our continuing mission to provide you with exceptional heart care, we have created designated Provider Care Teams.  These Care Teams include your primary Cardiologist (physician) and Advanced Practice Providers (APPs -  Physician Assistants and Nurse Practitioners) who all work together to provide you with the care you need, when you need it.  We recommend signing up for the patient portal called "MyChart".  Sign up information is provided on this After Visit Summary.  MyChart is used to connect with patients for Virtual Visits (Telemedicine).  Patients are able to view lab/test results, encounter notes,  upcoming appointments, etc.  Non-urgent messages can be sent to your provider as well.   To learn more about what you can do with MyChart, go to [email or web address].    Your next appointment:   3 month(s)  Provider:   Donato Heinz, MD     Other Instructions Please check your blood pressure at home twice daily, write it down.  Call the office or send message via Mychart with the readings in 1 week for Dr. Gardiner Rhyme to review.      Signed, Donato Heinz, MD  01/20/2023 10:19 AM    [place]

## 2023-01-20 ENCOUNTER — Ambulatory Visit: Payer: Medicare Other | Attending: Cardiology | Admitting: Cardiology

## 2023-01-20 ENCOUNTER — Ambulatory Visit (INDEPENDENT_AMBULATORY_CARE_PROVIDER_SITE_OTHER): Payer: Medicare Other

## 2023-01-20 ENCOUNTER — Encounter: Payer: Self-pay | Admitting: Cardiology

## 2023-01-20 VITALS — BP 164/84 | HR 57 | Ht 68.0 in | Wt 173.2 lb

## 2023-01-20 DIAGNOSIS — R002 Palpitations: Secondary | ICD-10-CM

## 2023-01-20 DIAGNOSIS — I2510 Atherosclerotic heart disease of native coronary artery without angina pectoris: Secondary | ICD-10-CM

## 2023-01-20 DIAGNOSIS — I10 Essential (primary) hypertension: Secondary | ICD-10-CM | POA: Diagnosis not present

## 2023-01-20 DIAGNOSIS — E785 Hyperlipidemia, unspecified: Secondary | ICD-10-CM | POA: Diagnosis not present

## 2023-01-20 MED ORDER — EZETIMIBE 10 MG PO TABS: 10.0000 mg | ORAL_TABLET | Freq: Every day | ORAL | 3 refills | Status: DC

## 2023-01-20 NOTE — Patient Instructions (Signed)
Medication Instructions:  START Zetia 10 mg daily  *If you need a refill on your cardiac medications before your next appointment, please call your pharmacy*   Lab Work: Please return for FASTING labs in 2 months (Lipid)  Our in office lab hours are Monday-Friday 8:00-4:00, closed for lunch 12:45-1:45 pm.  No appointment needed.  [facility] locations:   [place] ([place]) - [id number] [place] [address] [place] - [address] [place] - [address] ([facility])  Testing/Procedures: [place] Monitor Instructions   Your physician has requested you wear a ZIO patch monitor for _14_ days.  This is a single patch monitor.   [employer] supplies one patch monitor per enrollment. Additional stickers are not available. Please do not apply patch if you will be having a Nuclear Stress Test, Echocardiogram, Cardiac CT, MRI, or Chest Xray during the period you would be wearing the monitor. The patch cannot be worn during these tests. You cannot remove and re-apply the ZIO XT patch monitor.  Your ZIO patch monitor will be sent Fed Ex from [employer] directly to your home address. It may take 3-5 days to receive your monitor after you have been enrolled.  Once you have received your monitor, please review the enclosed instructions. Your monitor has already been registered assigning a specific monitor serial # to you.  Billing and Patient Assistance Program Information   We have supplied [employer] with any of your insurance information on file for billing purposes. [employer] offers a sliding scale Patient Assistance Program for patients that do not have insurance, or whose insurance does not completely  cover the cost of the ZIO monitor.   You must apply for the Patient Assistance Program to qualify for this discounted rate.     To apply, please call [employer] at [phone], select option 4, then select option 2, and ask to apply for Patient Assistance Program.  Theodore Demark will ask your household income, and how many people are in your household.  They will quote your out-of-pocket cost based on that information.  [employer] will also be able to set up a 47-month interest-free payment plan if needed.  Applying the monitor   Shave hair from upper left chest.  Hold abrader disc by orange tab. Rub abrader in 40 strokes over the upper left chest as indicated in your monitor instructions.  Clean area with 4 enclosed alcohol pads. Let dry.  Apply patch as indicated in monitor instructions. Patch will be placed under collarbone on left side of chest with arrow pointing upward.  Rub patch adhesive wings for 2 minutes. Remove white label marked "1". Remove the white label marked "2". Rub patch adhesive wings for 2 additional minutes.  While looking in a mirror, press and release button in center of patch. A small green light will flash 3-4 times. This will be your only indicator that the monitor has been turned on. ?  Do not shower for the first 24 hours. You may shower after the first 24 hours.  Press the button if you feel a symptom. You will hear a small click. Record Date, Time and Symptom in  the Patient Logbook.  When you are ready to remove the patch, follow instructions on the last 2 pages of the Patient Logbook. Stick patch monitor onto the last page of Patient Logbook.  Place Patient Logbook in the blue and white box.  Use locking tab on box and tape box closed securely.  The blue and white box has prepaid postage on it. Please place it in the mailbox as soon as possible. Your physician should have your test results approximately 7 days after the monitor has been mailed back to [facility]  Call [place] at [phone] if you have questions regarding your ZIO XT patch monitor. Call them immediately if you see an orange light blinking on your monitor.  If your monitor falls off in less than 4 days, contact our Monitor department at [phone]. ?If your monitor becomes loose or falls off after 4 days call [employer] at [phone] for suggestions on securing your monitor.?  Follow-Up: At [facility], you and your health needs are our priority.  As part of our continuing mission to provide you with exceptional heart care, we have created designated Provider Care Teams.  These Care Teams include your primary Cardiologist (physician) and Advanced Practice Providers (APPs -  Physician Assistants and Nurse Practitioners) who all work together to provide you with the care you need, when you need it.  We recommend signing up for the patient portal called "MyChart".  Sign up information is provided on this After Visit Summary.  MyChart is used to connect with patients for Virtual Visits (Telemedicine).  Patients are able to view lab/test results, encounter notes, upcoming appointments, etc.  Non-urgent messages can be sent to your provider as well.   To learn more about what you can do with MyChart, go to [email or web address].    Your next appointment:   3 month(s)  Provider:   Donato Heinz, MD     Other Instructions Please check your blood pressure at home twice daily, write it down.  Call the office or send message via Mychart with the readings in 1 week for Dr. Gardiner Rhyme to review.

## 2023-01-20 NOTE — Progress Notes (Unsigned)
Enrolled for [employer] to mail a ZIO XT long term holter monitor to the patients address on file.  

## 2023-01-23 DIAGNOSIS — R002 Palpitations: Secondary | ICD-10-CM

## 2023-01-29 ENCOUNTER — Encounter: Payer: Self-pay | Admitting: Cardiology

## 2023-02-11 DIAGNOSIS — R002 Palpitations: Secondary | ICD-10-CM | POA: Diagnosis not present

## 2023-03-01 ENCOUNTER — Encounter: Payer: Self-pay | Admitting: Cardiology

## 2023-03-01 ENCOUNTER — Other Ambulatory Visit: Payer: Self-pay | Admitting: *Deleted

## 2023-03-23 DIAGNOSIS — E785 Hyperlipidemia, unspecified: Secondary | ICD-10-CM | POA: Diagnosis not present

## 2023-03-23 DIAGNOSIS — I2510 Atherosclerotic heart disease of native coronary artery without angina pectoris: Secondary | ICD-10-CM | POA: Diagnosis not present

## 2023-03-23 LAB — LIPID PANEL
Chol/HDL Ratio: 2.4 ratio (ref 0.0–5.0)
Cholesterol, Total: 120 mg/dL (ref 100–199)
HDL: 50 mg/dL (ref >39)
LDL Chol Calc (NIH): 55 mg/dL (ref 0–99)
Triglycerides: 73 mg/dL (ref 0–149)
VLDL Cholesterol Cal: 15 mg/dL (ref 5–40)

## 2023-05-18 ENCOUNTER — Ambulatory Visit: Payer: Medicare Other | Admitting: Cardiology

## 2023-05-24 NOTE — Progress Notes (Unsigned)
Cardiology Office Note:    Date:  05/25/2023   ID:  Dustin Mcclure, DOB [birth year], MRN [id number]  PCP:  Daisy Floro, MD  Cardiologist:  Little Ishikawa, MD  Electrophysiologist:  None   Referring MD: Daisy Floro, MD   Chief Complaint  Patient presents with   Coronary Artery Disease    History of Present Illness:    Dustin Mcclure is a 82 y.o. male with a hx of CAD status post LAD angioplasty in 1990, hypertension, hyperlipidemia who presents for follow-up.  He was seen in the ED on 12/26/2021 and again on 12/31/2021 with chest pain.  Work-up unremarkable.  He describes that he was having pressure in the center of his chest.  Did not radiate.  Reports he walks on the treadmill for 15 minutes, denies any exertional symptoms.  He has not had any chest pain since discharge from the ED.  He denies any lightheadedness, syncope, lower extremity edema, palpitations.  He smoked for 6 to 7 years in his 62s, less than 1 pack/day, quit age 74.  Family history includes brother had multiple MIs, first in 59s and has pacemaker.  Reports BP has been 120s over 60s when checks at home.  Echocardiogram 02/11/2022 showed EF 65 to 70%, grade 1 diastolic dysfunction, normal RV function, no significant valvular disease.  Lexiscan Myoview on 02/11/2022 showed small fixed apical lesion (suspect artifact), EF 55%.  Zio patch x 13.5 days on 01/2023 showed 1 episode of NSVT lasting 6 beats, junctional rhythm present.  Since last clinic visit, he reports he is doing well.  Has had no further palpitations.  Denies any chest pain, dyspnea, or lower extremity edema.  Reports some lightheadedness intermittently but no syncope.  Walks 5 days/week on treadmill for about 15 minutes, also does walks in the neighborhood and does some weightlifting.  Past Medical History:  Diagnosis Date   CAD (coronary artery disease)    HTN (hypertension)    Hypercholesteremia     Past Surgical History:  Procedure  Laterality Date   CORONARY ANGIOPLASTY  1990   mid-LAD    Current Medications: Current Meds  Medication Sig   aspirin EC 81 MG tablet Take 81 mg by mouth daily. Swallow whole.   atorvastatin (LIPITOR) 40 MG tablet Take 1 tablet (40 mg total) by mouth daily. Please schedule appointment for further refills   ezetimibe (ZETIA) 10 MG tablet Take 1 tablet (10 mg total) by mouth daily.   famotidine (PEPCID) 20 MG tablet Take 20 mg by mouth daily as needed.   finasteride (PROSCAR) 5 MG tablet Take 5 mg by mouth daily.   losartan (COZAAR) 25 MG tablet Take 1 tablet (25 mg total) by mouth daily.   metoprolol succinate (TOPROL-XL) 50 MG 24 hr tablet Take 25 mg by mouth in the morning.     Allergies:   Patient has no known allergies.   Social History   Socioeconomic History   Marital status: Married    Spouse name: Not on file   Number of children: Not on file   Years of education: Not on file   Highest education level: Not on file  Occupational History   Not on file  Tobacco Use   Smoking status: Former    Types: Cigarettes    Quit date: 73    Years since quitting: 51.5   Smokeless tobacco: Never  Substance and Sexual Activity   Alcohol use: Not on file   Drug use: Not on file  Sexual activity: Not on file  Other Topics Concern   Not on file  Social History Narrative   Not on file   Social Determinants of Health   Financial Resource Strain: Not on file  Food Insecurity: Not on file  Transportation Needs: Not on file  Physical Activity: Not on file  Stress: Not on file  Social Connections: Not on file     Family History: The patient's family history is not on file.  ROS:   Please see the history of present illness.     All other systems reviewed and are negative.  EKGs/Labs/Other Studies Reviewed:    The following studies were reviewed today:   EKG:   01/15/22: Normal sinus rhythm, rate 62, T wave inversions in leads aVL, V2/3 01/20/23: NSR, rate 57  Recent  Labs: No results found for requested labs within last 365 days.  Recent Lipid Panel    Component Value Date/Time   CHOL 120 03/23/2023 0821   TRIG 73 03/23/2023 0821   HDL 50 03/23/2023 0821   CHOLHDL 2.4 03/23/2023 0821   LDLCALC 55 03/23/2023 0821    Physical Exam:    VS:  BP (!) 152/74   Pulse 61   Ht 5\' 9"  (1.753 m)   Wt 172 lb 3.2 oz (78.1 kg)   SpO2 97%   BMI 25.43 kg/m     Wt Readings from Last 3 Encounters:  05/25/23 172 lb 3.2 oz (78.1 kg)  01/20/23 173 lb 3.2 oz (78.6 kg)  02/11/22 180 lb (81.6 kg)     GEN:  Well nourished, well developed in no acute distress HEENT: Normal NECK: No JVD; No carotid bruits LYMPHATICS: No lymphadenopathy CARDIAC: RRR, no murmurs, rubs, gallops RESPIRATORY:  Clear to auscultation without rales, wheezing or rhonchi  ABDOMEN: Soft, non-tender, non-distended MUSCULOSKELETAL:  trace edema; No deformity  SKIN: Warm and dry NEUROLOGIC:  Alert and oriented x 3 PSYCHIATRIC:  Normal affect   ASSESSMENT:    1. CAD in native artery   2. Medication management   3. Palpitations   4. Essential hypertension   5. Hyperlipidemia, unspecified hyperlipidemia type     PLAN:    Palpitations: Zio patch x 13.5 days on 01/2023 showed 1 episode of NSVT lasting 6 beats, junctional rhythm present.  Also noted to have low resting heart rate, Toprol-XL dose decreased to 25 mg daily  Chest pain: Atypical in description.  Echocardiogram 02/11/2022 showed EF 65 to 70%, grade 1 diastolic dysfunction, normal RV function, no significant valvular disease.  Lexiscan Myoview on 02/11/2022 showed small fixed apical lesion (suspect artifact), EF 55%.  CAD: Status post angioplasty 1990 to mid LAD.  On aspirin, atorvastatin, metoprolol  Hyperlipidemia: On atorvastatin 40 mg every other day.  LDL 96 on 12/10/2021.  Increased atorvastatin to 40 mg daily for goal LDL less than 70.  LDL 84 on 12/18/2022.  Added Zetia 10 mg daily, LDL 55 on 03/23/2023  Hypertension: On  Toprol-XL 25 mg daily.  BP 152/74 in clinic today, was 158/68 on recheck.  Recommend adding losartan 25 mg daily.  Check BMET in 1 week.  Asked to check BP daily for next 2 weeks and let us know results.  RTC in 1 year   Medication Adjustments/Labs and Tests Ordered: Current medicines are reviewed at length with the patient today.  Concerns regarding medicines are outlined above.  Orders Placed This Encounter  Procedures   Basic metabolic panel   Meds ordered this encounter  Medications  losartan (COZAAR) 25 MG tablet    Sig: Take 1 tablet (25 mg total) by mouth daily.    Dispense:  90 tablet    Refill:  3    Patient Instructions  Medication Instructions:  Your physician has recommended you make the following change in your medication:  START: Losartan 25 mg once daily.  Please take your blood pressure daily for 2 weeks and send in a MyChart message. Please include heart rates.   HOW TO TAKE YOUR BLOOD PRESSURE: Rest 5 minutes before taking your blood pressure. Don't smoke or drink caffeinated beverages for at least 30 minutes before. Take your blood pressure before (not after) you eat. Sit comfortably with your back supported and both feet on the floor (don't cross your legs). Elevate your arm to heart level on a table or a desk. Use the proper sized cuff. It should fit smoothly and snugly around your bare upper arm. There should be enough room to slip a fingertip under the cuff. The bottom edge of the cuff should be 1 inch above the crease of the elbow. Ideally, take 3 measurements at one sitting and record the average.  *If you need a refill on your cardiac medications before your next appointment, please call your pharmacy*   Lab Work: Your physician recommends that you have labs drawn today: BMET If you have labs (blood work) drawn today and your tests are completely normal, you will receive your results only by: MyChart Message (if you have MyChart) OR A paper copy  in the mail If you have any lab test that is abnormal or we need to change your treatment, we will call you to review the results.   Testing/Procedures: None   Follow-Up: At [facility], you and your health needs are our priority.  As part of our continuing mission to provide you with exceptional heart care, we have created designated Provider Care Teams.  These Care Teams include your primary Cardiologist (physician) and Advanced Practice Providers (APPs -  Physician Assistants and Nurse Practitioners) who all work together to provide you with the care you need, when you need it.  Your next appointment:   1 year(s)  Provider:   Little Ishikawa, MD    Signed, Little Ishikawa, MD  05/25/2023 10:21 AM    [facility]

## 2023-05-25 ENCOUNTER — Encounter: Payer: Self-pay | Admitting: Cardiology

## 2023-05-25 ENCOUNTER — Ambulatory Visit: Payer: Medicare Other | Attending: Cardiology | Admitting: Cardiology

## 2023-05-25 VITALS — BP 152/74 | HR 61 | Ht 69.0 in | Wt 172.2 lb

## 2023-05-25 DIAGNOSIS — Z79899 Other long term (current) drug therapy: Secondary | ICD-10-CM

## 2023-05-25 DIAGNOSIS — E785 Hyperlipidemia, unspecified: Secondary | ICD-10-CM

## 2023-05-25 DIAGNOSIS — I2510 Atherosclerotic heart disease of native coronary artery without angina pectoris: Secondary | ICD-10-CM | POA: Diagnosis not present

## 2023-05-25 DIAGNOSIS — R002 Palpitations: Secondary | ICD-10-CM | POA: Diagnosis not present

## 2023-05-25 DIAGNOSIS — I10 Essential (primary) hypertension: Secondary | ICD-10-CM

## 2023-05-25 MED ORDER — LOSARTAN POTASSIUM 25 MG PO TABS: 25.0000 mg | ORAL_TABLET | Freq: Every day | ORAL | 3 refills | Status: DC

## 2023-05-25 NOTE — Patient Instructions (Addendum)
Medication Instructions:  Your physician has recommended you make the following change in your medication:  START: Losartan (Cozaar) 25 mg once daily.  Please take your blood pressure daily for 2 weeks and send in a MyChart message or call the office. Please include heart rates.   HOW TO TAKE YOUR BLOOD PRESSURE: Rest 5 minutes before taking your blood pressure. Don't smoke or drink caffeinated beverages for at least 30 minutes before. Take your blood pressure before (not after) you eat. Sit comfortably with your back supported and both feet on the floor (don't cross your legs). Elevate your arm to heart level on a table or a desk. Use the proper sized cuff. It should fit smoothly and snugly around your bare upper arm. There should be enough room to slip a fingertip under the cuff. The bottom edge of the cuff should be 1 inch above the crease of the elbow. Ideally, take 3 measurements at one sitting and record the average.  *If you need a refill on your cardiac medications before your next appointment, please call your pharmacy*   Lab Work: Your physician recommends that you have labs drawn today: BMET If you have labs (blood work) drawn today and your tests are completely normal, you will receive your results only by: MyChart Message (if you have MyChart) OR A paper copy in the mail If you have any lab test that is abnormal or we need to change your treatment, we will call you to review the results.   Testing/Procedures: None   Follow-Up: At [facility], you and your health needs are our priority.  As part of our continuing mission to provide you with exceptional heart care, we have created designated Provider Care Teams.  These Care Teams include your primary Cardiologist (physician) and Advanced Practice Providers (APPs -  Physician Assistants and Nurse Practitioners) who all work together to provide you with the care you need, when you need it.  Your next appointment:    1 year(s)  Provider:   Little Ishikawa, MD

## 2023-06-04 DIAGNOSIS — Z79899 Other long term (current) drug therapy: Secondary | ICD-10-CM | POA: Diagnosis not present

## 2023-06-04 LAB — BASIC METABOLIC PANEL
BUN/Creatinine Ratio: 19 (ref 10–24)
BUN: 15 mg/dL (ref 8–27)
CO2: 26 mmol/L (ref 20–29)
Calcium: 9.4 mg/dL (ref 8.6–10.2)
Chloride: 102 mmol/L (ref 96–106)
Creatinine, Ser: 0.81 mg/dL (ref 0.76–1.27)
Glucose: 92 mg/dL (ref 70–99)
Potassium: 5.3 mmol/L — ABNORMAL HIGH (ref 3.5–5.2)
Sodium: 142 mmol/L (ref 134–144)
eGFR: 89 mL/min/{1.73_m2} (ref >59)

## 2023-06-07 ENCOUNTER — Encounter: Payer: Self-pay | Admitting: Cardiology

## 2023-06-07 ENCOUNTER — Telehealth: Payer: Self-pay | Admitting: *Deleted

## 2023-06-07 DIAGNOSIS — I10 Essential (primary) hypertension: Secondary | ICD-10-CM

## 2023-06-07 DIAGNOSIS — Z79899 Other long term (current) drug therapy: Secondary | ICD-10-CM

## 2023-06-07 NOTE — Telephone Encounter (Signed)
-----   Message from Little Ishikawa sent at 06/07/2023  6:16 AM EDT ----- Stable kidney function but potassium mildly elevated.  Recommend repeat BMET this week to confirm, but have drawn at [facility] long

## 2023-06-07 NOTE — Telephone Encounter (Signed)
The patient has been notified of the result and verbalized understanding.  All questions (if any) were answered.  Patient wanted to know if he should  stop taking new medication - Losartan 25 mg  Patient also states he left blood pressure reading for Dr Bjorn Pippin  to review.  RN informed patient  Dr Bjorn Pippin will be in the office 06/08/23 to review. Patient is also  Tobin Chad, RN 06/07/2023 10:39 AM

## 2023-06-09 NOTE — Telephone Encounter (Signed)
Tried calling patient several times to discuss but no answer.  Would stop the losartan and start amlodipine 5 mg daily, which will not affect potassium levels.  Check BP daily for next 2 weeks and let us know results

## 2023-06-10 MED ORDER — AMLODIPINE BESYLATE 5 MG PO TABS: 5.0000 mg | ORAL_TABLET | Freq: Every day | ORAL | 5 refills | Status: DC

## 2023-06-15 ENCOUNTER — Telehealth: Payer: Self-pay

## 2023-06-15 DIAGNOSIS — Z79899 Other long term (current) drug therapy: Secondary | ICD-10-CM

## 2023-06-15 DIAGNOSIS — I10 Essential (primary) hypertension: Secondary | ICD-10-CM | POA: Diagnosis not present

## 2023-06-15 NOTE — Telephone Encounter (Signed)
Per MD, OK to order PSA.  Order placed and lab made aware.  Patient made aware

## 2023-06-15 NOTE — Telephone Encounter (Signed)
Patient in lab today and had BMP drawn.  He asked if PSA could be drawn.  Tube drawn but need OK to order since usually drawn with PCP.  Please advise if ok to order and send with pending lab

## 2023-06-15 NOTE — Telephone Encounter (Signed)
That is frustrating, why did Dustin Mcclure long not recognize the order for the BMET?  We can repeat BMET here

## 2023-06-16 LAB — PSA: Prostate Specific Ag, Serum: 0.5 ng/mL (ref 0.0–4.0)

## 2023-06-16 LAB — BASIC METABOLIC PANEL
BUN/Creatinine Ratio: 18 (ref 10–24)
BUN: 14 mg/dL (ref 8–27)
CO2: 28 mmol/L (ref 20–29)
Calcium: 9.2 mg/dL (ref 8.6–10.2)
Chloride: 102 mmol/L (ref 96–106)
Creatinine, Ser: 0.8 mg/dL (ref 0.76–1.27)
Glucose: 96 mg/dL (ref 70–99)
Potassium: 4.7 mmol/L (ref 3.5–5.2)
Sodium: 140 mmol/L (ref 134–144)
eGFR: 89 mL/min/{1.73_m2} (ref >59)

## 2023-06-19 ENCOUNTER — Other Ambulatory Visit: Payer: Self-pay

## 2023-06-19 ENCOUNTER — Emergency Department (HOSPITAL_COMMUNITY): Payer: Medicare Other

## 2023-06-19 ENCOUNTER — Encounter (HOSPITAL_COMMUNITY): Payer: Self-pay

## 2023-06-19 ENCOUNTER — Inpatient Hospital Stay (HOSPITAL_COMMUNITY)
Admission: EM | Admit: 2023-06-19 | Discharge: 2023-06-23 | DRG: 287 | Disposition: A | Discharge status: Home / Self Care | Payer: Medicare Other | Attending: Cardiovascular Disease | Admitting: Cardiovascular Disease

## 2023-06-19 DIAGNOSIS — Z87891 Personal history of nicotine dependence: Secondary | ICD-10-CM

## 2023-06-19 DIAGNOSIS — E871 Hypo-osmolality and hyponatremia: Secondary | ICD-10-CM | POA: Diagnosis present

## 2023-06-19 DIAGNOSIS — Z91011 Allergy to milk products: Secondary | ICD-10-CM

## 2023-06-19 DIAGNOSIS — Z79899 Other long term (current) drug therapy: Secondary | ICD-10-CM

## 2023-06-19 DIAGNOSIS — Z8249 Family history of ischemic heart disease and other diseases of the circulatory system: Secondary | ICD-10-CM | POA: Diagnosis not present

## 2023-06-19 DIAGNOSIS — R7989 Other specified abnormal findings of blood chemistry: Principal | ICD-10-CM | POA: Diagnosis present

## 2023-06-19 DIAGNOSIS — I2510 Atherosclerotic heart disease of native coronary artery without angina pectoris: Secondary | ICD-10-CM | POA: Diagnosis not present

## 2023-06-19 DIAGNOSIS — K219 Gastro-esophageal reflux disease without esophagitis: Secondary | ICD-10-CM | POA: Diagnosis present

## 2023-06-19 DIAGNOSIS — E875 Hyperkalemia: Secondary | ICD-10-CM | POA: Diagnosis not present

## 2023-06-19 DIAGNOSIS — I10 Essential (primary) hypertension: Secondary | ICD-10-CM | POA: Diagnosis present

## 2023-06-19 DIAGNOSIS — I5032 Chronic diastolic (congestive) heart failure: Secondary | ICD-10-CM | POA: Diagnosis present

## 2023-06-19 DIAGNOSIS — E782 Mixed hyperlipidemia: Secondary | ICD-10-CM | POA: Diagnosis present

## 2023-06-19 DIAGNOSIS — E876 Hypokalemia: Secondary | ICD-10-CM | POA: Diagnosis not present

## 2023-06-19 DIAGNOSIS — Z951 Presence of aortocoronary bypass graft: Secondary | ICD-10-CM

## 2023-06-19 DIAGNOSIS — R931 Abnormal findings on diagnostic imaging of heart and coronary circulation: Secondary | ICD-10-CM | POA: Diagnosis not present

## 2023-06-19 DIAGNOSIS — R002 Palpitations: Secondary | ICD-10-CM | POA: Diagnosis present

## 2023-06-19 DIAGNOSIS — I214 Non-ST elevation (NSTEMI) myocardial infarction: Secondary | ICD-10-CM

## 2023-06-19 DIAGNOSIS — I110 Hypertensive heart disease with heart failure: Secondary | ICD-10-CM | POA: Diagnosis not present

## 2023-06-19 DIAGNOSIS — R079 Chest pain, unspecified: Secondary | ICD-10-CM | POA: Diagnosis not present

## 2023-06-19 DIAGNOSIS — R778 Other specified abnormalities of plasma proteins: Secondary | ICD-10-CM | POA: Diagnosis not present

## 2023-06-19 LAB — CBC
HCT: 43.3 % (ref 39.0–52.0)
Hemoglobin: 14.5 g/dL (ref 13.0–17.0)
MCH: 30.5 pg (ref 26.0–34.0)
MCHC: 33.5 g/dL (ref 30.0–36.0)
MCV: 91 fL (ref 80.0–100.0)
Platelets: 197 10*3/uL (ref 150–400)
RBC: 4.76 MIL/uL (ref 4.22–5.81)
RDW: 13.2 % (ref 11.5–15.5)
WBC: 6.4 10*3/uL (ref 4.0–10.5)
nRBC: 0 % (ref 0.0–0.2)

## 2023-06-19 LAB — URINALYSIS, ROUTINE W REFLEX MICROSCOPIC
Bilirubin Urine: NEGATIVE
Glucose, UA: NEGATIVE mg/dL
Hgb urine dipstick: NEGATIVE
Ketones, ur: NEGATIVE mg/dL
Leukocytes,Ua: NEGATIVE
Nitrite: NEGATIVE
Protein, ur: NEGATIVE mg/dL
Specific Gravity, Urine: 1.008 (ref 1.005–1.030)
pH: 7 (ref 5.0–8.0)

## 2023-06-19 LAB — BASIC METABOLIC PANEL
Anion gap: 10 (ref 5–15)
BUN: 15 mg/dL (ref 8–23)
CO2: 25 mmol/L (ref 22–32)
Calcium: 9.2 mg/dL (ref 8.9–10.3)
Chloride: 101 mmol/L (ref 98–111)
Creatinine, Ser: 0.67 mg/dL (ref 0.61–1.24)
GFR, Estimated: >60 mL/min (ref >60)
Glucose, Bld: 106 mg/dL — ABNORMAL HIGH (ref 70–99)
Potassium: 4 mmol/L (ref 3.5–5.1)
Sodium: 136 mmol/L (ref 135–145)

## 2023-06-19 LAB — TROPONIN I (HIGH SENSITIVITY)
Troponin I (High Sensitivity): 153 ng/L (ref <18)
Troponin I (High Sensitivity): 183 ng/L (ref <18)

## 2023-06-19 LAB — HEPARIN LEVEL (UNFRACTIONATED): Heparin Unfractionated: <0.1 IU/mL — ABNORMAL LOW (ref 0.30–0.70)

## 2023-06-19 LAB — CBG MONITORING, ED: Glucose-Capillary: 114 mg/dL — ABNORMAL HIGH (ref 70–99)

## 2023-06-19 MED ORDER — NITROGLYCERIN 0.4 MG SL SUBL: 0.4000 mg | SUBLINGUAL_TABLET | SUBLINGUAL | Status: DC | PRN

## 2023-06-19 MED ORDER — POLYETHYLENE GLYCOL 3350 17 G PO PACK: 17.0000 g | PACK | Freq: Every day | ORAL | Status: DC | PRN

## 2023-06-19 MED ORDER — ACETAMINOPHEN 650 MG RE SUPP: 650.0000 mg | Freq: Four times a day (QID) | RECTAL | Status: DC | PRN

## 2023-06-19 MED ORDER — ENOXAPARIN SODIUM 40 MG/0.4ML IJ SOSY: 40.0000 mg | PREFILLED_SYRINGE | INTRAMUSCULAR | Status: DC

## 2023-06-19 MED ORDER — ONDANSETRON HCL 4 MG PO TABS: 4.0000 mg | ORAL_TABLET | Freq: Four times a day (QID) | ORAL | Status: DC | PRN

## 2023-06-19 MED ORDER — HYDRALAZINE HCL 20 MG/ML IJ SOLN: 10.0000 mg | Freq: Four times a day (QID) | INTRAMUSCULAR | Status: DC | PRN

## 2023-06-19 MED ORDER — ONDANSETRON HCL 4 MG/2ML IJ SOLN: 4.0000 mg | Freq: Four times a day (QID) | INTRAMUSCULAR | Status: DC | PRN

## 2023-06-19 MED ORDER — EZETIMIBE 10 MG PO TABS
10.0000 mg | ORAL_TABLET | Freq: Every day | ORAL | Status: DC
  Administered 2023-06-20 – 2023-06-23 (×4): 10 mg via ORAL
  Filled 2023-06-19 (×5): qty 1

## 2023-06-19 MED ORDER — ASPIRIN 81 MG PO CHEW
324.0000 mg | CHEWABLE_TABLET | Freq: Once | ORAL | Status: AC
  Administered 2023-06-19: 324 mg via ORAL
  Filled 2023-06-19: qty 4

## 2023-06-19 MED ORDER — ACETAMINOPHEN 325 MG PO TABS: 650.0000 mg | ORAL_TABLET | Freq: Four times a day (QID) | ORAL | Status: DC | PRN

## 2023-06-19 MED ORDER — METOPROLOL SUCCINATE ER 25 MG PO TB24
25.0000 mg | ORAL_TABLET | Freq: Every day | ORAL | Status: DC
  Administered 2023-06-20 – 2023-06-23 (×4): 25 mg via ORAL
  Filled 2023-06-19 (×4): qty 1

## 2023-06-19 MED ORDER — HEPARIN (PORCINE) 25000 UT/250ML-% IV SOLN
900.0000 [IU]/h | INTRAVENOUS | Status: DC
  Administered 2023-06-19: 900 [IU]/h via INTRAVENOUS
  Administered 2023-06-20: 1050 [IU]/h via INTRAVENOUS
  Filled 2023-06-19 (×3): qty 250

## 2023-06-19 MED ORDER — FINASTERIDE 5 MG PO TABS
5.0000 mg | ORAL_TABLET | Freq: Every day | ORAL | Status: DC
  Administered 2023-06-19 – 2023-06-22 (×4): 5 mg via ORAL
  Filled 2023-06-19 (×4): qty 1

## 2023-06-19 MED ORDER — ATORVASTATIN CALCIUM 40 MG PO TABS
40.0000 mg | ORAL_TABLET | Freq: Every day | ORAL | Status: DC
  Administered 2023-06-19 – 2023-06-22 (×4): 40 mg via ORAL
  Filled 2023-06-19 (×4): qty 1

## 2023-06-19 MED ORDER — PANTOPRAZOLE SODIUM 40 MG PO TBEC
40.0000 mg | DELAYED_RELEASE_TABLET | Freq: Every day | ORAL | Status: DC
  Administered 2023-06-19 – 2023-06-23 (×5): 40 mg via ORAL
  Filled 2023-06-19 (×5): qty 1

## 2023-06-19 MED ORDER — AMLODIPINE BESYLATE 5 MG PO TABS
5.0000 mg | ORAL_TABLET | Freq: Every day | ORAL | Status: DC
  Administered 2023-06-20 – 2023-06-23 (×4): 5 mg via ORAL
  Filled 2023-06-19 (×4): qty 1

## 2023-06-19 MED ORDER — HEPARIN BOLUS VIA INFUSION
4000.0000 [IU] | Freq: Once | INTRAVENOUS | Status: AC
  Administered 2023-06-19: 4000 [IU] via INTRAVENOUS
  Filled 2023-06-19: qty 4000

## 2023-06-19 NOTE — Progress Notes (Signed)
ANTICOAGULATION CONSULT NOTE - Initial Consult  Pharmacy Consult for heparin Indication: chest pain/ACS  No Known Allergies  Patient Measurements:   Ht/wt order entered. In ED, paramedic reporting total body weight of 170 pounds = 77.3 kg  Heparin Dosing Weight: Use TBW of 77 kg  Vital Signs: Temp: 98.6 F (37 C) (07/27 1733) Temp Source: Oral (07/27 1733) BP: 170/86 (07/27 1733) Pulse Rate: 64 (07/27 1733)  Labs: Recent Labs    06/19/23 1335 06/19/23 1644  HGB 14.5  --   HCT 43.3  --   PLT 197  --   CREATININE 0.67  --   TROPONINIHS  --  153*    CrCl cannot be calculated (Unknown ideal weight.).   Medical History: Past Medical History:  Diagnosis Date   CAD (coronary artery disease)    HTN (hypertension)    Hypercholesteremia     Medications: No anticoagulants PTA  Assessment: Pt is an 13 yoM with PMH significant for CAD. Pharmacy consulted to dose heparin for ACS.  Today, 06/19/23 CBC: WNL at baseline Troponin: 153 > 183  Goal of Therapy:  Heparin level 0.3-0.7 units/ml Monitor platelets by anticoagulation protocol: Yes   Plan:  Heparin bolus of 4000 units IV once Heparin infusion of 900 units/hr Check 8 hour heparin level CBC, heparin level daily Monitor for signs of bleeding  Cindi Carbon, PharmD 06/19/2023,5:55 PM

## 2023-06-19 NOTE — ED Provider Notes (Signed)
[place] EMERGENCY DEPARTMENT AT [facility] Provider Note   CSN: [id number] Arrival date & time: 06/19/23  1300     History  Chief Complaint  Patient presents with   Irregular Heart Beat    Dustin Mcclure is a 82 y.o. male with medical history of hypercholesterolemia, hypertension, CAD status post LAD angioplasty 1990.  Patient presents to ED for evaluation of palpitations, weakness.  Patient reports 3 days ago after consuming coffee for breakfast he had a 1 hour episode where he felt weak and had feelings of palpitations in his chest.  The patient denies that any chest pain, shortness of breath, lightheadedness, dizziness occurred during this event.  He reports that this event lasted for 1 hour and then resolved.  Patient states that today after consuming coffee again during breakfast, he stood up and had a 15-minute period of weakness as well as a 3-hour period where he felt as if he was having palpitations in his chest.  The patient states that at this time he took his blood pressure which was slightly elevated as well as his pulse which was in the 90s.  He reports that he typically has a resting heart rate in the 50s.  Per chart review, the patient recently had his metoprolol dosage decreased from 50 mg to 25 mg.  He states that his cardiologist also recently started him on 5 mg of amlodipine for blood pressure control.  Patient states currently he feels fine.  Patient denies any chest pain, shortness of breath, lightheadedness, dizziness, weakness.  HPI     Home Medications Prior to Admission medications   Medication Sig Start Date End Date Taking? Authorizing Provider  amLODipine (NORVASC) 5 MG tablet Take 1 tablet (5 mg total) by mouth daily. 06/10/23 09/08/23 Yes Little Ishikawa, MD  atorvastatin (LIPITOR) 40 MG tablet Take 1 tablet (40 mg total) by mouth daily. Please schedule appointment for further refills Patient taking differently: Take 40 mg by mouth at  bedtime. 09/03/22  Yes Little Ishikawa, MD  ezetimibe (ZETIA) 10 MG tablet Take 1 tablet (10 mg total) by mouth daily. 01/20/23 01/15/24 Yes Little Ishikawa, MD  famotidine (PEPCID) 20 MG tablet Take 20 mg by mouth daily as needed for heartburn or indigestion (or GERD-like symptoms).   Yes [provider]  finasteride (PROSCAR) 5 MG tablet Take 5 mg by mouth daily in the afternoon. 09/05/21  Yes [provider]  metoprolol succinate (TOPROL-XL) 50 MG 24 hr tablet Take 25 mg by mouth in the morning. 11/20/21  Yes [provider]  vardenafil (LEVITRA) 20 MG tablet Take 20 mg by mouth daily as needed for erectile dysfunction.   Yes [provider]      Allergies    Milk-related compounds    Review of Systems   Review of Systems  Constitutional:  Negative for fever.  Respiratory:  Negative for shortness of breath.   Cardiovascular:  Positive for palpitations. Negative for chest pain and leg swelling.  Gastrointestinal:  Negative for abdominal pain, nausea and vomiting.  Neurological:  Positive for weakness. Negative for dizziness, syncope and light-headedness.  All other systems reviewed and are negative.   Physical Exam Updated Vital Signs BP (!) 170/86 (BP Location: Right Arm)   Pulse 64   Temp 98.6 F (37 C) (Oral)   Resp 16   SpO2 99%  Physical Exam Vitals and nursing note reviewed.  Constitutional:      General: He is not in acute  distress.    Appearance: Normal appearance. He is not ill-appearing, toxic-appearing or diaphoretic.  HENT:     Head: Normocephalic and atraumatic.     Nose: Nose normal.     Mouth/Throat:     Mouth: Mucous membranes are moist.     Pharynx: Oropharynx is clear.  Eyes:     Extraocular Movements: Extraocular movements intact.     Conjunctiva/sclera: Conjunctivae normal.     Pupils: Pupils are equal, round, and reactive to light.  Cardiovascular:     Rate and Rhythm: Normal rate and regular rhythm.   Pulmonary:     Effort: Pulmonary effort is normal.     Breath sounds: Normal breath sounds. No wheezing.  Abdominal:     General: Abdomen is flat. Bowel sounds are normal.     Palpations: Abdomen is soft.     Tenderness: There is no abdominal tenderness.  Musculoskeletal:     Cervical back: Normal range of motion and neck supple. No tenderness.     Right lower leg: No edema.     Left lower leg: No edema.  Skin:    General: Skin is warm and dry.     Capillary Refill: Capillary refill takes less than 2 seconds.  Neurological:     General: No focal deficit present.     Mental Status: He is alert and oriented to person, place, and time.     GCS: GCS eye subscore is 4. GCS verbal subscore is 5. GCS motor subscore is 6.     Cranial Nerves: Cranial nerves 2-12 are intact. No cranial nerve deficit.     Sensory: Sensation is intact. No sensory deficit.     Motor: Motor function is intact. No weakness.     Coordination: Coordination is intact. Heel to [facility] Test normal.     ED Results / Procedures / Treatments   Labs (all labs ordered are listed, but only abnormal results are displayed) Labs Reviewed  BASIC METABOLIC PANEL - Abnormal; Notable for the following components:      Result Value   Glucose, Bld 106 (*)    All other components within normal limits  URINALYSIS, ROUTINE W REFLEX MICROSCOPIC - Abnormal; Notable for the following components:   Color, Urine STRAW (*)    All other components within normal limits  HEPARIN LEVEL (UNFRACTIONATED) - Abnormal; Notable for the following components:   Heparin Unfractionated <0.10 (*)    All other components within normal limits  CBG MONITORING, ED - Abnormal; Notable for the following components:   Glucose-Capillary 114 (*)    All other components within normal limits  TROPONIN I (HIGH SENSITIVITY) - Abnormal; Notable for the following components:   Troponin I (High Sensitivity) 153 (*)    All other components within normal limits   TROPONIN I (HIGH SENSITIVITY) - Abnormal; Notable for the following components:   Troponin I (High Sensitivity) 183 (*)    All other components within normal limits  CBC  TROPONIN I (HIGH SENSITIVITY)    EKG EKG Interpretation Date/Time:  Saturday June 19 2023 17:33:42 EDT Ventricular Rate:  65 PR Interval:  175 QRS Duration:  96 QT Interval:  413 QTC Calculation: 430 R Axis:   -37  Text Interpretation: Sinus rhythm RAE, consider biatrial enlargement Left axis deviation Nonspecific T abnrm, anterolateral leads No significant change since last tracing Confirmed by Richardean Canal [id number]) on 06/19/2023 5:40:21 PM  Radiology DG Chest Portable 1 View  Result Date: 06/19/2023 CLINICAL DATA:  cp, troponin  elevated EXAM: PORTABLE CHEST 1 VIEW COMPARISON:  December 30, 2021 FINDINGS: The cardiomediastinal silhouette is unchanged in contour. No pleural effusion. No pneumothorax. No acute pleuroparenchymal abnormality. IMPRESSION: No acute cardiopulmonary abnormality. Electronically Signed   By: Meda Klinefelter M.D.   On: 06/19/2023 17:48    Procedures .Critical Care  Performed by: Al Decant, PA-C Authorized by: Al Decant, PA-C   Critical care provider statement:    Critical care time (minutes):  75   Critical care time was exclusive of:  Separately billable procedures and treating other patients   Critical care was necessary to treat or prevent imminent or life-threatening deterioration of the following conditions:  Cardiac failure   Critical care was time spent personally by me on the following activities:  Blood draw for specimens, ordering and performing treatments and interventions, ordering and review of laboratory studies, development of treatment plan with patient or surrogate, discussions with consultants, ordering and review of radiographic studies, pulse oximetry, re-evaluation of patient's condition, discussions with primary provider, evaluation of patient's  response to treatment, review of old charts, examination of patient, interpretation of cardiac output measurements and obtaining history from patient or surrogate   I assumed direction of critical care for this patient from another provider in my specialty: no     Care discussed with: admitting provider      Medications Ordered in ED Medications  aspirin chewable tablet 324 mg (324 mg Oral Given 06/19/23 1803)    ED Course/ Medical Decision Making/ A&P Clinical Course as of 06/19/23 1907  Sat Jun 19, 2023  1540 Patient concerned about higher resting heart rate however patient recently had metoprolol dosage decreased.  This occurred on 7/2. [CG]  1540 Patient blood pressure at baseline per recent cardiology visit. [CG]  1749 Dr Lily Peer [CG]    Clinical Course User Index [CG] Al Decant, PA-C    Medical Decision Making Amount and/or Complexity of Data Reviewed Labs: ordered.   82 year old male presents to ED for evaluation.  Please see HPI for further details.  On examination the patient is afebrile and nontachycardic.  His lung sounds are clear bilaterally and he is not hypoxic on room air.  His abdomen is soft and compressible throughout.  His neurological examination is at baseline.  He has no edema to bilateral lower extremities.  He is overall nontoxic in appearance.  He states that he feels "just fine".  Patient lab work ordered in triage include CBC, BMP, urinalysis, EKG.  Patient CBC shows no leukocytosis or anemia.  His metabolic panel shows no electrolyte derangement, no elevated creatinine, anion gap 10.  Urinalysis unremarkable.  EKG unremarkable, sinus rhythm.  After discussing with patient, I added on troponin as well as a chest x-ray.  Troponin elevated at 153.  EKG ordered at this time which again shows normal sinus rhythm.  Patient given aspirin 324 mg, started on heparin drip at this time.  Patient states he is asymptomatic at this time he denies chest  pain or shortness of breath.  Patient case discussed with fellow Dr. Lily Peer of cardiology who has requested the patient be admitted and transferred to [facility] for cardiac consultation.  Patient recently had metoprolol succinate dosage decreased from 50 mg to 25 mg.  The patient also recently had a Zio patch in March which showed nonsustained run of NSVT.  Patient case discussed with attending Dr. Leafy Half, [facility] hospitalist, who has agreed to admit the patient for further management and care.  The  patient is amenable to this plan.   Final Clinical Impression(s) / ED Diagnoses Final diagnoses:  Elevated troponin  NSTEMI (non-ST elevated myocardial infarction) [facility])    Rx / DC Orders ED Discharge Orders     None         Clent Ridges 06/19/23 1907    Charlynne Pander, MD 06/21/23 1850

## 2023-06-19 NOTE — H&P (Signed)
History and Physical    Patient: Dustin Mcclure MRN: [id number] DOA: 06/19/2023  Date of Service: the patient was seen and examined on 06/20/2023  Patient coming from: Home  Chief Complaint:  Chief Complaint  Patient presents with   Irregular Heart Beat    HPI:   82 year old male with past medical history of coronary artery disease status post LAD angioplasty in 1990, hypertension, hyperlipidemia, diastolic congestive heart failure (Echo 01/2022 EF 65-70% with G1DD), NSVT (identified via Zio patch 01/2023) presenting to [facility] emergency department with complaints of palpitations and weakness.  Patient explains for the past several days he has been experiencing lengthy episodes of intense palpitations.  3 days ago he had an episode that occurred at rest while drinking coffee resulting in intense palpitations that lasted for approximately 1 hour.  Patient denies outright chest pain or shortness of breath.  Patient denies lightheadedness or loss of consciousness.  Symptoms eventually resolved.  Earlier in the day today patient had a similar but more intense episode of palpitations that began at rest, this time lasting approximately 3 hours.  This prompted the patient to present to [facility] emergency permit for evaluation.  Upon evaluation in the emergency department patient was found to have elevated troponin of 153 with repeat troponin of 183.  Aspirin and heparin were initiated.  EDP discussed case with Dr. Lily Peer with [facility] cardiology who agreed with management and recommended hospitalization under the hospital service with cardiology consult at [facility].  The hospitalist group was then called to assess the patient for admission to the hospital.  Review of Systems: Review of Systems  Constitutional:  Positive for malaise/fatigue.  Cardiovascular:  Positive for palpitations.  All other systems reviewed and are negative.    Past Medical History:  Diagnosis Date    CAD (coronary artery disease)    HTN (hypertension)    Hypercholesteremia     Past Surgical History:  Procedure Laterality Date   CORONARY ANGIOPLASTY  1990   mid-LAD    Social History:  reports that he quit smoking about 51 years ago. His smoking use included cigarettes. He has never used smokeless tobacco. He reports current alcohol use. He reports that he does not use drugs.  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 It just makes my nose run a little."    Family History  Problem Relation Age of Onset   Heart attack Brother     Prior to Admission medications   Medication Sig Start Date End Date Taking? Authorizing Provider  amLODipine (NORVASC) 5 MG tablet Take 1 tablet (5 mg total) by mouth daily. 06/10/23 09/08/23 Yes Little Ishikawa, MD  atorvastatin (LIPITOR) 40 MG tablet Take 1 tablet (40 mg total) by mouth daily. Please schedule appointment for further refills Patient taking differently: Take 40 mg by mouth at bedtime. 09/03/22  Yes Little Ishikawa, MD  ezetimibe (ZETIA) 10 MG tablet Take 1 tablet (10 mg total) by mouth daily. 01/20/23 01/15/24 Yes Little Ishikawa, MD  famotidine (PEPCID) 20 MG tablet Take 20 mg by mouth daily as needed for heartburn or indigestion (or GERD-like symptoms).   Yes [provider]  finasteride (PROSCAR) 5 MG tablet Take 5 mg by mouth daily in the afternoon. 09/05/21  Yes [provider]  metoprolol succinate (TOPROL-XL) 50 MG 24 hr tablet Take 25 mg by mouth in the morning.  11/20/21  Yes [provider]  vardenafil (LEVITRA) 20 MG tablet Take 20 mg by mouth daily as needed for erectile dysfunction.   Yes [provider]    Physical Exam:  Vitals:   06/19/23 1900 06/19/23 1930 06/19/23 2000 06/19/23 2149  BP: (!) 154/83 (!) 143/81 (!) 157/93 (!) 148/68  Pulse: 66 62 70 62  Resp: 16 17 18  14   Temp:    97.9 F (36.6 C)  TempSrc:    Oral  SpO2: 98% 97% 98% 99%    Constitutional: Awake alert and oriented x3, no associated distress.   Skin: no rashes, no lesions, good skin turgor noted. Eyes: Pupils are equally reactive to light.  No evidence of scleral icterus or conjunctival pallor.  ENMT: Moist mucous membranes noted.  Posterior pharynx clear of any exudate or lesions.   Neck: normal, supple, no masses, no thyromegaly.  No evidence of jugular venous distension.   Respiratory: clear to auscultation bilaterally, no wheezing, no crackles. Normal respiratory effort. No accessory muscle use.  Cardiovascular: Regular rate and rhythm, no murmurs / rubs / gallops. No extremity edema. 2+ pedal pulses. No carotid bruits.  Chest:   Nontender without crepitus or deformity.   Back:   Nontender without crepitus or deformity. Abdomen: Abdomen is soft and nontender.  No evidence of intra-abdominal masses.  Positive bowel sounds noted in all quadrants.   Musculoskeletal: No joint deformity upper and lower extremities. Good ROM, no contractures. Normal muscle tone.  Neurologic: CN 2-12 grossly intact. Sensation intact.  Patient moving all 4 extremities spontaneously.  Patient is following all commands.  Patient is responsive to verbal stimuli.   Psychiatric: Patient exhibits normal mood with appropriate affect.  Patient seems to possess insight as to their current situation.    Data Reviewed:  I have personally reviewed and interpreted labs, imaging.  Significant findings are   Lab Results  Component Value Date   WBC 6.4 06/19/2023   HGB 14.5 06/19/2023   HCT 43.3 06/19/2023   MCV 91.0 06/19/2023   PLT 197 06/19/2023   Lab Results  Component Value Date   K 4.0 06/19/2023   Lab Results  Component Value Date   BUN 15 06/19/2023   Lab Results  Component Value Date   CREATININE 0.67 06/19/2023    CXR:   Chest X-ray was personally reviewed.  No evidence of focal infiltrates.   No evidence of pleural effusion.  No evidence of pneumothorax.    EKG: Personally reviewed.  Rhythm is normal sinus rhythm with heart rate of 65 bpm.  No dynamic ST segment changes appreciated.  Assessment and Plan: * Elevated troponin Patient currently chest pain-free and no evidence of dynamic EKG changes despite slightly elevated troponin That being said, considering patient's frequent episodes of extended palpitations in the past several days slightly elevated troponin may be supply/demand mismatch secondary to episodes of arrhythmia Placing patient in progressive unit Monitoring patient on telemetry We will continue to cycle cardiac enzymes EDP has initiated aspirin and heparin.  These will both be continued at this time Continuing home regimen of metoprolol succinate 25 mg daily. EDP discussed case with Dr. Lily Peer with [facility] cardiology who states that they will evaluate patient in the morning in consultation at [facility] Will make patient n.p.o. after midnight in case of possible invasive evaluation As needed sublingual nitroglycerin for episodes of chest discomfort.  If Recurrent will transition to nitroglycerin infusion  Coronary artery disease involving native coronary artery of native heart  See assessment and plan above  Essential hypertension Continue home regimen of metoprolol succinate 25 mg daily and losartan 25 mg daily As needed intravenous hydralazine for markedly elevated blood pressure  Mixed hyperlipidemia Continuing home regimen of ezetimibe and atorvastatin Obtain lipid panel  GERD without esophagitis Protonix 40 mg daily       Code Status:  Full code  code status decision has been confirmed with: patient and wife Family Communication: wife is at the bedside and has been updated on plan of care   Consults: Dr. Lily Peer with [facility]  Severity of Illness:  The appropriate patient status for this patient is OBSERVATION. Observation status is judged  to be reasonable and necessary in order to provide the required intensity of service to ensure the patient's safety. The patient's presenting symptoms, physical exam findings, and initial radiographic and laboratory data in the context of their medical condition is felt to place them at decreased risk for further clinical deterioration. Furthermore, it is anticipated that the patient will be medically stable for discharge from the hospital within 2 midnights of admission.   Author:  Marinda Elk MD  06/20/2023 12:19 AM

## 2023-06-19 NOTE — ED Notes (Signed)
Report called and given to 3 [employer] pilot.  All questions answered.

## 2023-06-19 NOTE — ED Triage Notes (Signed)
Pt arrived POV reporting "arrhythmias " that he has seen on his watch for the last few days, lasted the longest today, 2 hours. States has CAD. Recent check with cardiologist and everything was fine. Endorses he also feel weak every time he wakes up in the morning. Also states his HR is 80-95 and it is concerning him. Reports his resting HR is usually in the 50's. Denies CP, SOB,Dizziness or any other symptoms.

## 2023-06-19 NOTE — ED Notes (Signed)
[facility] called. Transported to be arranged

## 2023-06-20 ENCOUNTER — Observation Stay (HOSPITAL_COMMUNITY): Payer: Medicare Other

## 2023-06-20 ENCOUNTER — Encounter (HOSPITAL_COMMUNITY): Payer: Self-pay | Admitting: Internal Medicine

## 2023-06-20 DIAGNOSIS — E782 Mixed hyperlipidemia: Secondary | ICD-10-CM | POA: Diagnosis not present

## 2023-06-20 DIAGNOSIS — Z951 Presence of aortocoronary bypass graft: Secondary | ICD-10-CM | POA: Diagnosis not present

## 2023-06-20 DIAGNOSIS — I214 Non-ST elevation (NSTEMI) myocardial infarction: Secondary | ICD-10-CM

## 2023-06-20 DIAGNOSIS — Z87891 Personal history of nicotine dependence: Secondary | ICD-10-CM | POA: Diagnosis not present

## 2023-06-20 DIAGNOSIS — Z8249 Family history of ischemic heart disease and other diseases of the circulatory system: Secondary | ICD-10-CM | POA: Diagnosis not present

## 2023-06-20 DIAGNOSIS — Z79899 Other long term (current) drug therapy: Secondary | ICD-10-CM | POA: Diagnosis not present

## 2023-06-20 DIAGNOSIS — I5032 Chronic diastolic (congestive) heart failure: Secondary | ICD-10-CM | POA: Diagnosis not present

## 2023-06-20 DIAGNOSIS — R002 Palpitations: Secondary | ICD-10-CM | POA: Diagnosis not present

## 2023-06-20 DIAGNOSIS — I2510 Atherosclerotic heart disease of native coronary artery without angina pectoris: Secondary | ICD-10-CM | POA: Diagnosis not present

## 2023-06-20 DIAGNOSIS — R7989 Other specified abnormal findings of blood chemistry: Secondary | ICD-10-CM

## 2023-06-20 DIAGNOSIS — K219 Gastro-esophageal reflux disease without esophagitis: Secondary | ICD-10-CM | POA: Diagnosis not present

## 2023-06-20 DIAGNOSIS — E871 Hypo-osmolality and hyponatremia: Secondary | ICD-10-CM | POA: Diagnosis not present

## 2023-06-20 DIAGNOSIS — Z91011 Allergy to milk products: Secondary | ICD-10-CM | POA: Diagnosis not present

## 2023-06-20 DIAGNOSIS — E875 Hyperkalemia: Secondary | ICD-10-CM | POA: Diagnosis not present

## 2023-06-20 DIAGNOSIS — I110 Hypertensive heart disease with heart failure: Secondary | ICD-10-CM | POA: Diagnosis not present

## 2023-06-20 DIAGNOSIS — E876 Hypokalemia: Secondary | ICD-10-CM | POA: Diagnosis not present

## 2023-06-20 LAB — CBC WITH DIFFERENTIAL/PLATELET
Abs Immature Granulocytes: 0.03 10*3/uL (ref 0.00–0.07)
Basophils Absolute: 0 10*3/uL (ref 0.0–0.1)
Basophils Relative: 0 %
Eosinophils Absolute: 0 10*3/uL (ref 0.0–0.5)
Eosinophils Relative: 1 %
HCT: 39.4 % (ref 39.0–52.0)
Hemoglobin: 13.3 g/dL (ref 13.0–17.0)
Immature Granulocytes: 1 %
Lymphocytes Relative: 29 %
Lymphs Abs: 1.9 10*3/uL (ref 0.7–4.0)
MCH: 29.7 pg (ref 26.0–34.0)
MCHC: 33.8 g/dL (ref 30.0–36.0)
MCV: 87.9 fL (ref 80.0–100.0)
Monocytes Absolute: 0.5 10*3/uL (ref 0.1–1.0)
Monocytes Relative: 7 %
Neutro Abs: 4.1 10*3/uL (ref 1.7–7.7)
Neutrophils Relative %: 62 %
Platelets: 180 10*3/uL (ref 150–400)
RBC: 4.48 MIL/uL (ref 4.22–5.81)
RDW: 13.1 % (ref 11.5–15.5)
WBC: 6.5 10*3/uL (ref 4.0–10.5)
nRBC: 0 % (ref 0.0–0.2)

## 2023-06-20 LAB — COMPREHENSIVE METABOLIC PANEL WITH GFR
ALT: 18 U/L (ref 0–44)
AST: 23 U/L (ref 15–41)
Albumin: 3.5 g/dL (ref 3.5–5.0)
Alkaline Phosphatase: 53 U/L (ref 38–126)
Anion gap: 10 (ref 5–15)
BUN: 12 mg/dL (ref 8–23)
CO2: 26 mmol/L (ref 22–32)
Calcium: 9.2 mg/dL (ref 8.9–10.3)
Chloride: 100 mmol/L (ref 98–111)
Creatinine, Ser: 0.77 mg/dL (ref 0.61–1.24)
GFR, Estimated: >60 mL/min (ref >60)
Glucose, Bld: 95 mg/dL (ref 70–99)
Potassium: 3.6 mmol/L (ref 3.5–5.1)
Sodium: 136 mmol/L (ref 135–145)
Total Bilirubin: 2 mg/dL — ABNORMAL HIGH (ref 0.3–1.2)
Total Protein: 5.9 g/dL — ABNORMAL LOW (ref 6.5–8.1)

## 2023-06-20 LAB — LIPID PANEL
Cholesterol: 122 mg/dL (ref 0–200)
HDL: 58 mg/dL (ref >40)
LDL Cholesterol: 56 mg/dL (ref 0–99)
Total CHOL/HDL Ratio: 2.1 RATIO
Triglycerides: 41 mg/dL (ref <150)
VLDL: 8 mg/dL (ref 0–40)

## 2023-06-20 LAB — ECHOCARDIOGRAM COMPLETE
Area-P 1/2: 2.56 cm2
Height: 68 in
MV M vel: 0.82 m/s
MV Peak grad: 2.7 mmHg
S' Lateral: 2.7 cm
Weight: 2659.2 oz

## 2023-06-20 LAB — HEPARIN LEVEL (UNFRACTIONATED)
Heparin Unfractionated: 0.22 IU/mL — ABNORMAL LOW (ref 0.30–0.70)
Heparin Unfractionated: 0.43 IU/mL (ref 0.30–0.70)
Heparin Unfractionated: 0.34 IU/mL (ref 0.30–0.70)

## 2023-06-20 LAB — TROPONIN I (HIGH SENSITIVITY): Troponin I (High Sensitivity): 102 ng/L (ref <18)

## 2023-06-20 LAB — MAGNESIUM: Magnesium: 2.1 mg/dL (ref 1.7–2.4)

## 2023-06-20 MED ORDER — ASPIRIN 325 MG PO TABS
325.0000 mg | ORAL_TABLET | Freq: Every day | ORAL | Status: DC
  Administered 2023-06-20 – 2023-06-21 (×2): 325 mg via ORAL
  Filled 2023-06-20 (×2): qty 1

## 2023-06-20 MED ORDER — LOSARTAN POTASSIUM 25 MG PO TABS: 25.0000 mg | ORAL_TABLET | Freq: Every day | ORAL | Status: DC

## 2023-06-20 NOTE — Assessment & Plan Note (Addendum)
Continue home regimen of metoprolol succinate 25 mg daily and losartan 25 mg daily As needed intravenous hydralazine for markedly elevated blood pressure

## 2023-06-20 NOTE — Plan of Care (Signed)

## 2023-06-20 NOTE — ED Notes (Signed)
Patient upset about length of wait for transportation to [facility].  Attempted to explain process and patient remains upset.  Waiting to speak to MD or Charge RN.  Charge RN aware and will speak to patient.  Patient has been updated multiple times during stay, but remains upset

## 2023-06-20 NOTE — Consult Note (Addendum)
Cardiology Consultation   Patient ID: Dustin Mcclure MRN: [id number]; DOB: Sep 11, 1941  Admit date: 06/19/2023 Date of Consult: 06/20/2023  PCP:  Daisy Floro, MD    [facility] Providers Cardiologist:  Little Ishikawa, MD        Patient Profile:   Dustin Mcclure is a 82 y.o. male with a hx of CAD, with PTCA to LAD in 1990, HTN, HLD  who is being seen 06/20/2023 for the evaluation of NSTEMI at the request of Dr. Jarvis Newcomer.  History of Present Illness:   Dustin Mcclure with above hx with PTCA to LAD in 1990, and HTN and HLD.  Echo in 01/2022 with EF 65-70%, G1DD, normal RV function, and myoview 01/2022 with small fixed apical lesion suspect artifact, EF 55%.  Zio patch for 2 weeks 01/2023  with 1 episode of NSVT lasting 6 beats junctional rhythm present.  Has been active with walking 5 days a week and then 15 min on treadmill.  It was noted low resting HR in July this year and BB decreased to 25 mg daily and losartan added .     Yesterday pt presented to ER at [facility] for arrhythmias described as intense palpitations that he checked BP and it was stable and HR on BP check was in 90s.  One lasted 3 hours which prompted ER visit.  Also complained of weakness.  No chest pain.    Hs troponin 153, 183 and 102  Na 136 K+ 3.6 BUN 12 Cr 0.77 Mg+ 2.1  T chol 122, HDL 58, LDL 56 TG 41  WBC 6.5 Hgb 78.2 plts 180  PCXR  NAD  EKG:  The EKG was personally reviewed and demonstrates:  SR non specific T wave abnormalities no acute changes from 12/2022  Telemetry:  Telemetry was personally reviewed and demonstrates:  SR rare PVC   BP 143/89 P 71 R 18 afebrile   Past Medical History:  Diagnosis Date   CAD (coronary artery disease)    HTN (hypertension)    Hypercholesteremia     Past Surgical History:  Procedure Laterality Date   CORONARY ANGIOPLASTY  1990   mid-LAD     Home Medications:  Prior to Admission medications   Medication Sig Start Date End Date Taking? Authorizing  Provider  amLODipine (NORVASC) 5 MG tablet Take 1 tablet (5 mg total) by mouth daily. 06/10/23 09/08/23 Yes Little Ishikawa, MD  atorvastatin (LIPITOR) 40 MG tablet Take 1 tablet (40 mg total) by mouth daily. Please schedule appointment for further refills Patient taking differently: Take 40 mg by mouth at bedtime. 09/03/22  Yes Little Ishikawa, MD  ezetimibe (ZETIA) 10 MG tablet Take 1 tablet (10 mg total) by mouth daily. 01/20/23 01/15/24 Yes Little Ishikawa, MD  famotidine (PEPCID) 20 MG tablet Take 20 mg by mouth daily as needed for heartburn or indigestion (or GERD-like symptoms).   Yes [provider]  finasteride (PROSCAR) 5 MG tablet Take 5 mg by mouth daily in the afternoon. 09/05/21  Yes [provider]  metoprolol succinate (TOPROL-XL) 50 MG 24 hr tablet Take 25 mg by mouth in the morning. 11/20/21  Yes [provider]  vardenafil (LEVITRA) 20 MG tablet Take 20 mg by mouth daily as needed for erectile dysfunction.   Yes [provider]    Inpatient Medications: Scheduled Meds:  amLODipine  5 mg Oral Daily   aspirin  325 mg Oral Daily   atorvastatin  40 mg Oral QHS  ezetimibe  10 mg Oral Daily   finasteride  5 mg Oral Q1500   losartan  25 mg Oral Daily   metoprolol succinate  25 mg Oral Daily   pantoprazole  40 mg Oral Daily   Continuous Infusions:  heparin 1,050 Units/hr (06/20/23 0641)   PRN Meds: acetaminophen **OR** acetaminophen, hydrALAZINE, nitroGLYCERIN, ondansetron **OR** ondansetron (ZOFRAN) IV, polyethylene glycol  Allergies:    Allergies  Allergen Reactions   Milk-Related Compounds Other (See Comments)    "I have a mild allergy to milk, but have some daily. It just makes my nose run a little."    Social History:   Social History   Socioeconomic History   Marital status: Married    Spouse name: Not on file   Number of children: Not on file   Years of education: Not on file   Highest education  level: Not on file  Occupational History   Not on file  Tobacco Use   Smoking status: Former    Current packs/day: 0.00    Types: Cigarettes    Quit date: 55    Years since quitting: 51.6   Smokeless tobacco: Never  Substance and Sexual Activity   Alcohol use: Yes    Comment: occationally   Drug use: Never   Sexual activity: Not on file  Other Topics Concern   Not on file  Social History Narrative   Not on file   Social Determinants of Health   Financial Resource Strain: Not on file  Food Insecurity: No Food Insecurity (06/20/2023)   Hunger Vital Sign    Worried About Running Out of Food in the Last Year: Never true    Ran Out of Food in the Last Year: Never true  Transportation Needs: No Transportation Needs (06/20/2023)   PRAPARE - Administrator, Civil Service (Medical): No    Lack of Transportation (Non-Medical): No  Physical Activity: Not on file  Stress: Not on file  Social Connections: Not on file  Intimate Partner Violence: Not At Risk (06/20/2023)   Humiliation, Afraid, Rape, and Kick questionnaire    Fear of Current or Ex-Partner: No    Emotionally Abused: No    Physically Abused: No    Sexually Abused: No    Family History:    Family History  Problem Relation Age of Onset   Heart attack Brother      ROS:  Please see the history of present illness.  General:no colds or fevers, no weight changes Skin:no rashes or ulcers HEENT:no blurred vision, no congestion CV:see HPI PUL:see HPI GI:no diarrhea constipation or melena, no indigestion GU:no hematuria, no dysuria MS:no joint pain, no claudication Neuro:no syncope, no lightheadedness Endo:no diabetes, no thyroid disease  All other ROS reviewed and negative.     Physical Exam/Data:   Vitals:   06/20/23 0400 06/20/23 0436 06/20/23 0446 06/20/23 0737  BP: 114/73  (!) 159/76 (!) 143/89  Pulse: 63  71 67  Resp: 17  18 18   Temp: 98.5 F (36.9 C)  97.9 F (36.6 C) 97.8 F (36.6 C)   TempSrc: Oral  Oral Oral  SpO2: 98%  100% 99%  Weight:  75.4 kg    Height:  5\' 8"  (1.727 m)      Intake/Output Summary (Last 24 hours) at 06/20/2023 0939 Last data filed at 06/20/2023 0839 Gross per 24 hour  Intake 112.25 ml  Output 150 ml  Net -37.75 ml      06/20/2023    4:36  AM 05/25/2023    9:25 AM 01/20/2023    9:16 AM  Last 3 Weights  Weight (lbs) 166 lb 3.2 oz 172 lb 3.2 oz 173 lb 3.2 oz  Weight (kg) 75.388 kg 78.109 kg 78.563 kg     Body mass index is 25.27 kg/m.  General:  Well nourished, well developed, in no acute distress HEENT: normal Neck: no JVD Vascular: No carotid bruits; Distal pulses 2+ bilaterally Cardiac:  normal S1, S2; RRR; no murmur gallup rub or click Lungs:  clear to auscultation bilaterally, no wheezing, rhonchi or rales  Abd: soft, nontender, no hepatomegaly  Ext: no edema Musculoskeletal:  No deformities, BUE and BLE strength normal and equal Skin: warm and dry  Neuro:  alert and oriented X 3 MAE follows commands, no focal abnormalities noted Psych:  Normal affect     Relevant CV Studies: 02/11/22  echo 1. Left ventricular ejection fraction, by estimation, is 65 to 70%. The  left ventricle has normal function. The left ventricle has no regional  wall motion abnormalities. Left ventricular diastolic parameters are  consistent with Grade I diastolic  dysfunction (impaired relaxation).   2. Right ventricular systolic function is normal. The right ventricular  size is normal. Tricuspid regurgitation signal is inadequate for assessing  PA pressure.   3. The mitral valve is grossly normal. Trivial mitral valve  regurgitation.   4. The aortic valve is tricuspid. Aortic valve regurgitation is trivial.  Aortic valve sclerosis is present, with no evidence of aortic valve  stenosis.   Comparison(s): No prior Echocardiogram.   FINDINGS   Left Ventricle: Left ventricular ejection fraction, by estimation, is 65  to 70%. The left ventricle has  normal function. The left ventricle has no  regional wall motion abnormalities. The left ventricular internal cavity  size was normal in size. There is   no left ventricular hypertrophy. Left ventricular diastolic parameters  are consistent with Grade I diastolic dysfunction (impaired relaxation).  Normal left ventricular filling pressure.   Right Ventricle: The right ventricular size is normal. No increase in  right ventricular wall thickness. Right ventricular systolic function is  normal. Tricuspid regurgitation signal is inadequate for assessing PA  pressure.   Left Atrium: Left atrial size was normal in size.   Right Atrium: Right atrial size was normal in size.   Pericardium: There is no evidence of pericardial effusion.   Mitral Valve: The mitral valve is grossly normal. Trivial mitral valve  regurgitation.   Tricuspid Valve: The tricuspid valve is not well visualized. Tricuspid  valve regurgitation is not demonstrated.   Aortic Valve: The aortic valve is tricuspid. Aortic valve regurgitation is  trivial. Aortic valve sclerosis is present, with no evidence of aortic  valve stenosis.   Pulmonic Valve: The pulmonic valve was grossly normal. Pulmonic valve  regurgitation is trivial.   Aorta: The aortic root and ascending aorta are structurally normal, with  no evidence of dilitation.   Venous: The inferior vena cava was not well visualized.   IAS/Shunts: No atrial level shunt detected by color flow Doppler.     Myoview stress test 02/12/23 Addendum by Maisie Fus, MD on Wed Feb 11, 2022  4:25 PM     The study is low risk.   up sloping ST depression in the inferior leads was noted.   Left ventricular function is normal. No wall motion abnormalities. Nuclear stress EF: 55 %. The left ventricular ejection fraction is normal (55-65%). End diastolic cavity size is normal.  Prior study not available for comparison.   Small , moderate intensity fixed apical lesion  representing old scar.     Out pt monitor     1 episode of NSVT lasting 6 beats   Junctional rhythm was present   Laboratory Data:  High Sensitivity Troponin:   Recent Labs  Lab 06/19/23 1644 06/19/23 1801 06/20/23 0535  TROPONINIHS 153* 183* 102*     Chemistry Recent Labs  Lab 06/15/23 0824 06/19/23 1335 06/20/23 0535  NA 140 136 136  K 4.7 4.0 3.6  CL 102 101 100  CO2 28 25 26   GLUCOSE 96 106* 95  BUN 14 15 12   CREATININE 0.80 0.67 0.77  CALCIUM 9.2 9.2 9.2  MG  --   --  2.1  GFRNONAA  --  >60 >60  ANIONGAP  --  10 10    Recent Labs  Lab 06/20/23 0535  PROT 5.9*  ALBUMIN 3.5  AST 23  ALT 18  ALKPHOS 53  BILITOT 2.0*   Lipids  Recent Labs  Lab 06/20/23 0535  CHOL 122  TRIG 41  HDL 58  LDLCALC 56  CHOLHDL 2.1    Hematology Recent Labs  Lab 06/19/23 1335 06/20/23 0535  WBC 6.4 6.5  RBC 4.76 4.48  HGB 14.5 13.3  HCT 43.3 39.4  MCV 91.0 87.9  MCH 30.5 29.7  MCHC 33.5 33.8  RDW 13.2 13.1  PLT 197 180   Thyroid No results for input(s): "TSH", "FREET4" in the last 168 hours.  BNPNo results for input(s): "BNP", "PROBNP" in the last 168 hours.  DDimer No results for input(s): "DDIMER" in the last 168 hours.   Radiology/Studies:  DG Chest Portable 1 View  Result Date: 06/19/2023 CLINICAL DATA:  cp, troponin elevated EXAM: PORTABLE CHEST 1 VIEW COMPARISON:  December 30, 2021 FINDINGS: The cardiomediastinal silhouette is unchanged in contour. No pleural effusion. No pneumothorax. No acute pleuroparenchymal abnormality. IMPRESSION: No acute cardiopulmonary abnormality. Electronically Signed   By: Meda Klinefelter M.D.   On: 06/19/2023 17:48     Assessment and Plan:   Palpitations, hx of NSVT and junct rhythm on 14 day monitor in March 2024.  Recent decrease of BB XL from  50 mg to 25 mg.    Elevated troponin NSTEMI could be from palpitations but hx of CAD with PTCA in 1990 no cath since.  Nuc in 2023 neg for ischemia - may be prudent for cardiac  cath. Vs cardiac CTA - Lillith Mcneff defer to MD  Echo pending  CAD with hx PTCA to LAD in 1990 - normal nuc in 2023 and stable Echo. HTN controlled  losartan was stopped as outpt due to elevated K+ - Dustin Mcclure stop now HLD continue zetia and atorvastatin   LDL 56 now   Risk Assessment/Risk Scores:     TIMI Risk Score for Unstable Angina or Non-ST Elevation MI:   The patient's TIMI risk score is 5, which indicates a 26% risk of all cause mortality, new or recurrent myocardial infarction or need for urgent revascularization in the next 14 days.          For questions or updates, please contact [facility] Please consult [email or web address] for contact info under    Signed, Nada Boozer, NP  06/20/2023 9:39 AM  I have seen and examined this patient with Nada Boozer.  Agree with above, note added to reflect my findings.  Patient with a past medical history as above.  He presented to emergency room with  palpitations.  Palpitations lasted for up to 3 hours.  He states that he usually drinks strong coffee in the mornings.  He had palpitations after drinking coffee.  When he presented emergency room he was in sinus rhythm.  He was noted to have a mildly elevated troponin.  GEN: Well nourished, well developed, in no acute distress  HEENT: normal  Neck: no JVD, carotid bruits, or masses Cardiac: RRR; no murmurs, rubs, or gallops,no edema  Respiratory:  clear to auscultation bilaterally, normal work of breathing GI: soft, nontender, nondistended, + BS MS: no deformity or atrophy  Skin: warm and dry Neuro:  Strength and sensation are intact Psych: euthymic mood, full affect   Palpitations: Patient has been in sinus rhythm since admission to the hospital.  He had a recent decrease in his metoprolol.  If he does not have arrhythmia while in the hospital, cardiac monitor versus Kardia mobile would be reasonable options.   Elevated troponin: Potentially due to demand ischemia.  Gevon Markus plan for  transthoracic echo.  If ejection fraction is normal, coronary CT would be a reasonable option. Coronary artery disease: Status post LAD stent.  Normal nuclear test in 2023. Hypertension: Continue home medications Hyperlipidemia: Continue atorvastatin  Addilynne Olheiser M. Alandra Sando MD 06/20/2023 10:52 AM

## 2023-06-20 NOTE — Progress Notes (Signed)
   Cardiology will assume care for Mr. Reckart.  Will monitor overnight and cardiac CTA in AM and hope to discharge tomorrow.    Nada Boozer, FNP-C At [facility]  [phone] or after 5pm and on weekends call [phone] 06/20/2023.now

## 2023-06-20 NOTE — Progress Notes (Signed)
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 It just makes my nose run a little."    Patient Measurements: Height: 5\' 8"  (172.7 cm) Weight: 75.4 kg (166 lb 3.2 oz) IBW/kg (Calculated) : 68.4 Heparin Dosing Weight: 75 kg  Vital Signs: Temp: 98.1 F (36.7 C) (07/28 2002) Temp Source: Oral (07/28 2002) BP: 113/71 (07/28 2002) Pulse Rate: 47 (07/28 2002)  Labs: Recent Labs    06/19/23 1335 06/19/23 1644 06/19/23 1801 06/19/23 1801 06/20/23 0535 06/20/23 1438 06/20/23 2014  HGB 14.5  --   --   --  13.3  --   --   HCT 43.3  --   --   --  39.4  --   --   PLT 197  --   --   --  180  --   --   HEPARINUNFRC  --   --  <0.10*   < > 0.22* 0.43 0.34  CREATININE 0.67  --   --   --  0.77  --   --   TROPONINIHS  --  153* 183*  --  102*  --   --    < > = values in this interval not displayed.    Estimated Creatinine Clearance: 71.3 mL/min (by C-G formula based on SCr of 0.77 mg/dL).   Medications: No anticoagulants PTA  Assessment: Pt is an 62 yoM with PMH significant for CAD. Pharmacy consulted to dose heparin for ACS.  Heparin level therapeutic at 0.34 on 1050 units/hr. No bleeding noted, CBC is normal.  Goal of Therapy:  Heparin level 0.3-0.7 units/ml Monitor platelets by anticoagulation protocol: Yes   Plan:  Continue heparin drip at 1050 units/hr Daily heparin level, CBC Monitor for s/sx of bleeding  Thank you for involving pharmacy in the patient's care.   Theotis Burrow, PharmD PGY1 Acute Care Pharmacy Resident  06/20/2023 8:42 PM

## 2023-06-20 NOTE — Assessment & Plan Note (Addendum)
Patient currently chest pain-free and no evidence of dynamic EKG changes despite slightly elevated troponin That being said, considering patient's frequent episodes of extended palpitations in the past several days slightly elevated troponin may be supply/demand mismatch secondary to episodes of arrhythmia Placing patient in progressive unit Monitoring patient on telemetry We will continue to cycle cardiac enzymes EDP has initiated aspirin and heparin.  These will both be continued at this time Continuing home regimen of metoprolol succinate 25 mg daily. EDP discussed case with Dr. Lily Peer with [facility] cardiology who states that they will evaluate patient in the morning in consultation at [facility] Will make patient n.p.o. after midnight in case of possible invasive evaluation As needed sublingual nitroglycerin for episodes of chest discomfort.  If Recurrent will transition to nitroglycerin infusion

## 2023-06-20 NOTE — Progress Notes (Signed)
   06/20/23 0446  Vitals  Temp 97.9 F (36.6 C)  Temp Source Oral  BP (!) 159/76  MAP (mmHg) 101  BP Location Right Arm  BP Method Automatic  Patient Position (if appropriate) Lying  Pulse Rate 71  Pulse Rate Source Monitor  ECG Heart Rate 74  Resp 18  MEWS COLOR  MEWS Score Color Green  Oxygen Therapy  SpO2 100 %  O2 Device Room Air  MEWS Score  MEWS Temp 0  MEWS Systolic 0  MEWS Pulse 0  MEWS RR 0  MEWS LOC 0  MEWS Score 0   Pt arrived from [facility] ED. A&ox4. Ambulated from cart to inpt bed. Steady gait. Skin assessment complete w/ second RN. Current plan of care discussed with pt. No questions at this time. Oriented to room and unit. Heparin gtt infusing as ordered. Call light & personal items in reach. Bed in lowest position. Non skid footwear on.

## 2023-06-20 NOTE — Progress Notes (Signed)
[facility] PROGRESS NOTE  Dustin Mcclure (DOB: 04/14/1941) ZOX:[id number] PCP: Daisy Floro, MD  Brief Narrative: Dustin Mcclure is an 82 y.o. male with a history of CAD, HFpEF, HTN, HLD, NSVT who presented to the ED on 06/19/2023 with palpitations and weakness. Exam and labs were largely reassuring including NSR on ECG. They were planning discharge when troponin resulted at 153. Cardiology was consulted, recommending IV heparin and admission to [facility] for NSTEMI.   Subjective: Feels fine, no further palpitations and never had chest pain or dyspnea. 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   Objective: BP (!) 141/81 (BP Location: Left Arm)   Pulse 67   Temp 97.9 F (36.6 C) (Oral)   Resp 18   Ht 5\' 8"  (1.727 m)   Wt 75.4 kg   SpO2 97%   BMI 25.27 kg/m   Gen: Pleasant male in no distress Pulm: Clear, nonlabored  CV: RRR, no MRG or edema GI: Soft, NT, ND, +BS  Neuro: Alert and oriented. No new focal deficits. Ext: Warm, no deformities. Skin: No rashes, lesions or ulcers on visualized skin   Assessment & Plan: NSTEMI, CAD:  - Appreciate formal cardiology recommendations. Echo showed preserved LVEF, plan is for coronary CTA, then cardiac catheterization if indicated - Continue heparin gtt - Continue aspirin - Continue low dose metoprolol - Continue statin  HTN:  - Continue norvasc, metoprolol  HLD:  - Continue statin, zetia. LDL is 56.   GERD:  - PPI   Tyrone Nine, MD [facility] [email or web address] 06/20/2023, 4:27 PM

## 2023-06-20 NOTE — Care Management Obs Status (Signed)
MEDICARE OBSERVATION STATUS NOTIFICATION   Patient Details  Name: Dustin Mcclure MRN: [id number] Date of Birth: 1941-06-06   Medicare Observation Status Notification Given:  Yes    Lawerance Sabal, RN 06/20/2023, 3:30 PM

## 2023-06-20 NOTE — ED Notes (Signed)
Patient transported to [facility] via [facility] at this time.  [facility] called and made aware

## 2023-06-20 NOTE — Assessment & Plan Note (Signed)
Continuing home regimen of ezetimibe and atorvastatin Obtain lipid panel

## 2023-06-20 NOTE — Assessment & Plan Note (Signed)
See assessment and plan above 

## 2023-06-20 NOTE — Progress Notes (Signed)
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 It just makes my nose run a little."    Patient Measurements: Height: 5\' 8"  (172.7 cm) Weight: 75.4 kg (166 lb 3.2 oz) IBW/kg (Calculated) : 68.4 Heparin Dosing Weight: 75 kg  Vital Signs: Temp: 97.9 F (36.6 C) (07/28 1128) Temp Source: Oral (07/28 1128) BP: 141/81 (07/28 1128) Pulse Rate: 67 (07/28 1128)  Labs: Recent Labs    06/19/23 1335 06/19/23 1644 06/19/23 1801 06/20/23 0535 06/20/23 1438  HGB 14.5  --   --  13.3  --   HCT 43.3  --   --  39.4  --   PLT 197  --   --  180  --   HEPARINUNFRC  --   --  <0.10* 0.22* 0.43  CREATININE 0.67  --   --  0.77  --   TROPONINIHS  --  153* 183* 102*  --     Estimated Creatinine Clearance: 71.3 mL/min (by C-G formula based on SCr of 0.77 mg/dL).   Medications: No anticoagulants PTA  Assessment: Pt is an 37 yoM with PMH significant for CAD. Pharmacy consulted to dose heparin for ACS.  Heparin level therapeutic at 0.43 on 1050 units/hr. No bleeding noted, CBC is normal.  Goal of Therapy:  Heparin level 0.3-0.7 units/ml Monitor platelets by anticoagulation protocol: Yes   Plan:  Continue heparin drip at 1050 units/hr 6 hr confirmatory heparin level Daily heparin level, CBC Monitor for s/sx of bleeding  Thank you for involving pharmacy in this patient's care.  Loura Back, PharmD, BCPS Clinical Pharmacist Clinical phone for 06/20/2023 is [phone] 06/20/2023 3:45 PM

## 2023-06-20 NOTE — Progress Notes (Signed)
  Echocardiogram 2D Echocardiogram has been performed.  Maren Reamer 06/20/2023, 2:26 PM

## 2023-06-20 NOTE — Assessment & Plan Note (Signed)
Protonix 40 mg daily

## 2023-06-20 NOTE — Progress Notes (Signed)
ANTICOAGULATION CONSULT NOTE  Pharmacy Consult for heparin Indication: chest pain/ACS  Allergies  Allergen Reactions   Milk-Related Compounds Other (See Comments)    "I have a mild allergy to milk, but have some daily. It just makes my nose run a little."    Patient Measurements: Height: 5\' 8"  (172.7 cm) Weight: 75.4 kg (166 lb 3.2 oz) IBW/kg (Calculated) : 68.4 Ht/wt order entered. In ED, paramedic reporting total body weight of 170 pounds = 77.3 kg  Heparin Dosing Weight: Use TBW of 77 kg  Vital Signs: Temp: 97.9 F (36.6 C) (07/28 0446) Temp Source: Oral (07/28 0446) BP: 159/76 (07/28 0446) Pulse Rate: 71 (07/28 0446)  Labs: Recent Labs    06/19/23 1335 06/19/23 1644 06/19/23 1801 06/20/23 0535  HGB 14.5  --   --  13.3  HCT 43.3  --   --  39.4  PLT 197  --   --  180  HEPARINUNFRC  --   --  <0.10* 0.22*  CREATININE 0.67  --   --   --   TROPONINIHS  --  153* 183*  --     Estimated Creatinine Clearance: 71.3 mL/min (by C-G formula based on SCr of 0.67 mg/dL).   Medical History: Past Medical History:  Diagnosis Date   CAD (coronary artery disease)    HTN (hypertension)    Hypercholesteremia     Medications: No anticoagulants PTA  Assessment: Pt is an 49 yoM with PMH significant for CAD. Pharmacy consulted to dose heparin for ACS.  7/28 AM update:  Heparin level sub-therapeutic   Goal of Therapy:  Heparin level 0.3-0.7 units/ml Monitor platelets by anticoagulation protocol: Yes   Plan:  Inc heparin to 1050 units/hr 1400 heparin level  Abran Duke, PharmD, BCPS Clinical Pharmacist Phone: [phone]

## 2023-06-21 ENCOUNTER — Observation Stay (HOSPITAL_COMMUNITY)
Admit: 2023-06-21 | Discharge: 2023-06-21 | Disposition: A | Discharge status: Home / Self Care | Payer: Medicare Other | Attending: Internal Medicine | Admitting: Internal Medicine

## 2023-06-21 ENCOUNTER — Observation Stay (HOSPITAL_COMMUNITY): Payer: Medicare Other

## 2023-06-21 ENCOUNTER — Other Ambulatory Visit: Payer: Self-pay | Admitting: Internal Medicine

## 2023-06-21 DIAGNOSIS — Z87891 Personal history of nicotine dependence: Secondary | ICD-10-CM | POA: Diagnosis not present

## 2023-06-21 DIAGNOSIS — R931 Abnormal findings on diagnostic imaging of heart and coronary circulation: Secondary | ICD-10-CM

## 2023-06-21 DIAGNOSIS — Z8249 Family history of ischemic heart disease and other diseases of the circulatory system: Secondary | ICD-10-CM | POA: Diagnosis not present

## 2023-06-21 DIAGNOSIS — I2510 Atherosclerotic heart disease of native coronary artery without angina pectoris: Secondary | ICD-10-CM | POA: Diagnosis not present

## 2023-06-21 DIAGNOSIS — I5032 Chronic diastolic (congestive) heart failure: Secondary | ICD-10-CM | POA: Diagnosis present

## 2023-06-21 DIAGNOSIS — E875 Hyperkalemia: Secondary | ICD-10-CM | POA: Diagnosis not present

## 2023-06-21 DIAGNOSIS — R002 Palpitations: Secondary | ICD-10-CM | POA: Diagnosis not present

## 2023-06-21 DIAGNOSIS — E782 Mixed hyperlipidemia: Secondary | ICD-10-CM | POA: Diagnosis present

## 2023-06-21 DIAGNOSIS — K219 Gastro-esophageal reflux disease without esophagitis: Secondary | ICD-10-CM | POA: Diagnosis present

## 2023-06-21 DIAGNOSIS — I110 Hypertensive heart disease with heart failure: Secondary | ICD-10-CM | POA: Diagnosis present

## 2023-06-21 DIAGNOSIS — I10 Essential (primary) hypertension: Secondary | ICD-10-CM | POA: Diagnosis not present

## 2023-06-21 DIAGNOSIS — Z951 Presence of aortocoronary bypass graft: Secondary | ICD-10-CM | POA: Diagnosis not present

## 2023-06-21 DIAGNOSIS — E871 Hypo-osmolality and hyponatremia: Secondary | ICD-10-CM | POA: Diagnosis present

## 2023-06-21 DIAGNOSIS — R7989 Other specified abnormal findings of blood chemistry: Secondary | ICD-10-CM | POA: Diagnosis not present

## 2023-06-21 DIAGNOSIS — Z79899 Other long term (current) drug therapy: Secondary | ICD-10-CM | POA: Diagnosis not present

## 2023-06-21 DIAGNOSIS — E876 Hypokalemia: Secondary | ICD-10-CM | POA: Diagnosis not present

## 2023-06-21 DIAGNOSIS — Z91011 Allergy to milk products: Secondary | ICD-10-CM | POA: Diagnosis not present

## 2023-06-21 LAB — HEMOGLOBIN A1C
Hgb A1c MFr Bld: 5.5 % (ref 4.8–5.6)
Mean Plasma Glucose: 111 mg/dL

## 2023-06-21 MED ORDER — NITROGLYCERIN 0.4 MG SL SUBL
SUBLINGUAL_TABLET | SUBLINGUAL | Status: AC
  Filled 2023-06-21: qty 2

## 2023-06-21 MED ORDER — IOHEXOL 350 MG/ML SOLN
100.0000 mL | Freq: Once | INTRAVENOUS | Status: AC | PRN
  Administered 2023-06-21: 100 mL via INTRAVENOUS

## 2023-06-21 MED ORDER — ASPIRIN 81 MG PO CHEW
81.0000 mg | CHEWABLE_TABLET | ORAL | Status: AC
  Administered 2023-06-22: 81 mg via ORAL
  Filled 2023-06-21: qty 1

## 2023-06-21 MED ORDER — ASPIRIN 325 MG PO TABS: 325.0000 mg | ORAL_TABLET | Freq: Every day | ORAL | Status: DC

## 2023-06-21 MED ORDER — SODIUM CHLORIDE 0.9 % WEIGHT BASED INFUSION
1.0000 mL/kg/h | INTRAVENOUS | Status: DC
  Administered 2023-06-22: 1 mL/kg/h via INTRAVENOUS

## 2023-06-21 MED ORDER — METOPROLOL TARTRATE 5 MG/5ML IV SOLN
INTRAVENOUS | Status: AC
  Filled 2023-06-21: qty 5

## 2023-06-21 MED ORDER — METOPROLOL TARTRATE 5 MG/5ML IV SOLN
5.0000 mg | Freq: Once | INTRAVENOUS | Status: AC
  Administered 2023-06-21: 5 mg via INTRAVENOUS

## 2023-06-21 MED ORDER — NITROGLYCERIN 0.4 MG SL SUBL
0.8000 mg | SUBLINGUAL_TABLET | Freq: Once | SUBLINGUAL | Status: AC
  Administered 2023-06-21: 0.8 mg via SUBLINGUAL

## 2023-06-21 MED ORDER — SODIUM CHLORIDE 0.9 % WEIGHT BASED INFUSION
3.0000 mL/kg/h | INTRAVENOUS | Status: DC
  Administered 2023-06-22: 3 mL/kg/h via INTRAVENOUS

## 2023-06-21 NOTE — H&P (View-Only) (Signed)
Patient Name: Dustin Mcclure Date of Encounter: 06/21/2023 [facility] Cardiologist: Little Ishikawa, MD   Interval Summary  .    82 y.o. male with a hx of CAD, with PTCA to LAD in 1990, HTN, HLD admitted for rapid irregular HR resolved on admit but with elevated troponin and with mild chest discomfort.  Today on way to CT scan --now back  no complaints no further rapid HR    Vital Signs .    Vitals:   06/20/23 2002 06/21/23 0500 06/21/23 0517 06/21/23 0727  BP: 113/71  124/80 119/72  Pulse: (!) 47  (!) 55 63  Resp: 20  19 18   Temp: 98.1 F (36.7 C)  97.6 F (36.4 C) 97.9 F (36.6 C)  TempSrc: Oral  Oral Oral  SpO2: 98%  99% 99%  Weight:  74.9 kg    Height:        Intake/Output Summary (Last 24 hours) at 06/21/2023 0931 Last data filed at 06/21/2023 0400 Gross per 24 hour  Intake 720.5 ml  Output 825 ml  Net -104.5 ml      06/21/2023    5:00 AM 06/20/2023    4:36 AM 05/25/2023    9:25 AM  Last 3 Weights  Weight (lbs) 165 lb 2 oz 166 lb 3.2 oz 172 lb 3.2 oz  Weight (kg) 74.9 kg 75.388 kg 78.109 kg      Telemetry/ECG     SR to SB at night to 49  rare PVC - Personally Reviewed  Echo 06/20/23 IMPRESSIONS     1. Left ventricular ejection fraction, by estimation, is 60 to 65%. The  left ventricle has normal function. The left ventricle has no regional  wall motion abnormalities. Left ventricular diastolic parameters are  consistent with Grade I diastolic  dysfunction (impaired relaxation).   2. Right ventricular systolic function is normal. The right ventricular  size is mildly enlarged. There is normal pulmonary artery systolic  pressure.   3. Left atrial size was mildly dilated.   4. The mitral valve is normal in structure. No evidence of mitral valve  regurgitation. No evidence of mitral stenosis.   5. The aortic valve is normal in structure. Aortic valve regurgitation is  trivial. No aortic stenosis is present.   6. The inferior vena cava  is normal in size with greater than 50%  respiratory variability, suggesting right atrial pressure of 3 mmHg.   Comparison(s): Prior images reviewed side by side.   FINDINGS   Left Ventricle: Left ventricular ejection fraction, by estimation, is 60  to 65%. The left ventricle has normal function. The left ventricle has no  regional wall motion abnormalities. The left ventricular internal cavity  size was normal in size. There is   no left ventricular hypertrophy. Left ventricular diastolic parameters  are consistent with Grade I diastolic dysfunction (impaired relaxation).   Right Ventricle: The right ventricular size is mildly enlarged. No  increase in right ventricular wall thickness. Right ventricular systolic  function is normal. There is normal pulmonary artery systolic pressure.  The tricuspid regurgitant velocity is 0.87   m/s, and with an assumed right atrial pressure of 3 mmHg, the estimated  right ventricular systolic pressure is 6.0 mmHg.   Left Atrium: Left atrial size was mildly dilated.   Right Atrium: Right atrial size was normal in size.   Pericardium: There is no evidence of pericardial effusion.   Mitral Valve: The mitral valve is normal in structure. No evidence of  mitral valve regurgitation. No evidence of mitral valve stenosis.   Tricuspid Valve: The tricuspid valve is normal in structure. Tricuspid  valve regurgitation is not demonstrated. No evidence of tricuspid  stenosis.   Aortic Valve: The aortic valve is normal in structure. Aortic valve  regurgitation is trivial. No aortic stenosis is present.   Pulmonic Valve: The pulmonic valve was normal in structure. Pulmonic valve  regurgitation is not visualized. No evidence of pulmonic stenosis.   Aorta: The aortic root is normal in size and structure.   Venous: The inferior vena cava is normal in size with greater than 50%  respiratory variability, suggesting right atrial pressure of 3 mmHg.    IAS/Shunts: No atrial level shunt detected by color flow Doppler.    Physical Exam .   Exam per Dr. Excell Seltzer GEN: No acute distress.   Neck: No JVD Cardiac: RRR, no murmurs, rubs, or gallops.  Respiratory: Clear to auscultation bilaterally. GI: Soft, nontender, non-distended  MS: No edema  Assessment & Plan .     Palpitations, hx of NSVT and junct rhythm on 14 day monitor 01/2023  --Recent decrease of BB XL from 50 mg to 25 mg.   Elevated Troponin/NSTEMI  --could be increase of troponin due to palpitations. --Echo stable for cardiac CTA today  CAD with hx PTCA to LAD in 1990 and on re-look per pt it had closed but had collateral circulation.   --last nuc 2023 neg for ischemia  HTN  --was on losartan but stopped due to elevated K+  --consider resuming post CTA  HLD --on zetia and atorvastatin. Continue  LDL 56 perfect  For questions or updates, please contact [facility] Please consult [email or web address] for contact info under        Signed, Nada Boozer, NP   Patient seen, examined. Available data reviewed. Agree with findings, assessment, and plan as outlined by Nada Boozer, NP.  The patient is alert, oriented, in no distress.  JVP is normal, HEENT is normal, lungs are clear bilaterally, heart is regular rate and rhythm with a soft ejection murmur at the right upper sternal border, abdomen is soft and nontender, extremities have no edema.  Right radial pulses 2+.  CT-FFR results reviewed with multivessel coronary artery disease, including the left main disease.  Recommendation for definitive evaluation with cardiac catheterization reviewed with the patient. I have reviewed the risks, indications, and alternatives to cardiac catheterization, possible angioplasty, and stenting with the patient. Risks include but are not limited to bleeding, infection, vascular injury, stroke, myocardial infection, arrhythmia, kidney injury, radiation-related injury in the case of  prolonged fluoroscopy use, emergency cardiac surgery, and death. The patient understands the risks of serious complication is 1-2 in 1000 with diagnostic cardiac cath and 1-2% or less with angioplasty/stenting.  The patient is currently feeling well and has no chest discomfort.  He has not been having exertional angina.  He presented over the weekend because of heart palpitations and was noted to have a mildly elevated high-sensitivity troponin with peak of 183.  He continues on amlodipine, aspirin, IV heparin, atorvastatin, and metoprolol.  Will place orders for cardiac catheterization tomorrow.  Tonny Bollman, M.D. 06/21/2023 2:19 PM

## 2023-06-21 NOTE — Progress Notes (Signed)
Consent for cardiac cath signed at this time.

## 2023-06-21 NOTE — Progress Notes (Signed)
CT sent for FFR 

## 2023-06-21 NOTE — Progress Notes (Signed)
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 It just makes my nose run a little."    Patient Measurements: Height: 5\' 8"  (172.7 cm) Weight: 74.9 kg (165 lb 2 oz) IBW/kg (Calculated) : 68.4 Heparin Dosing Weight: 75 kg  Vital Signs: Temp: 97.9 F (36.6 C) (07/29 0727) Temp Source: Oral (07/29 0727) BP: 119/72 (07/29 0727) Pulse Rate: 63 (07/29 0727)  Labs: Recent Labs    06/19/23 1335 06/19/23 1644 06/19/23 1801 06/19/23 1801 06/20/23 0535 06/20/23 1438 06/20/23 2014 06/21/23 0213  HGB 14.5  --   --   --  13.3  --   --  12.4*  HCT 43.3  --   --   --  39.4  --   --  36.9*  PLT 197  --   --   --  180  --   --  157  HEPARINUNFRC  --   --  <0.10*   < > 0.22* 0.43 0.34 0.71*  CREATININE 0.67  --   --   --  0.77  --   --   --   TROPONINIHS  --  153* 183*  --  102*  --   --   --    < > = values in this interval not displayed.    Estimated Creatinine Clearance: 71.3 mL/min (by C-G formula based on SCr of 0.77 mg/dL).   Medications: No anticoagulants PTA  Assessment: Pt is an 36 yoM with PMH significant for CAD. Pharmacy consulted to dose heparin for ACS.  Heparin level supratherapeutic at 0.71 on 1050 units/hr.  Plans for coronary CTA  Goal of Therapy:  Heparin level 0.3-0.7 units/ml Monitor platelets by anticoagulation protocol: Yes   Plan:  Decrease heparin to 1000 units/hr Daily heparin level, CBC  Harland German, PharmD Clinical Pharmacist **Pharmacist phone directory can now be found on [email or web address] ([email or web address]).  Listed under [facility].

## 2023-06-21 NOTE — Progress Notes (Addendum)
Patient Name: Dustin Mcclure Date of Encounter: 06/21/2023 [facility] Cardiologist: Little Ishikawa, MD   Interval Summary  .    82 y.o. male with a hx of CAD, with PTCA to LAD in 1990, HTN, HLD admitted for rapid irregular HR resolved on admit but with elevated troponin and with mild chest discomfort.  Today on way to CT scan --now back  no complaints no further rapid HR    Vital Signs .    Vitals:   06/20/23 2002 06/21/23 0500 06/21/23 0517 06/21/23 0727  BP: 113/71  124/80 119/72  Pulse: (!) 47  (!) 55 63  Resp: 20  19 18   Temp: 98.1 F (36.7 C)  97.6 F (36.4 C) 97.9 F (36.6 C)  TempSrc: Oral  Oral Oral  SpO2: 98%  99% 99%  Weight:  74.9 kg    Height:        Intake/Output Summary (Last 24 hours) at 06/21/2023 0931 Last data filed at 06/21/2023 0400 Gross per 24 hour  Intake 720.5 ml  Output 825 ml  Net -104.5 ml      06/21/2023    5:00 AM 06/20/2023    4:36 AM 05/25/2023    9:25 AM  Last 3 Weights  Weight (lbs) 165 lb 2 oz 166 lb 3.2 oz 172 lb 3.2 oz  Weight (kg) 74.9 kg 75.388 kg 78.109 kg      Telemetry/ECG     SR to SB at night to 49  rare PVC - Personally Reviewed  Echo 06/20/23 IMPRESSIONS     1. Left ventricular ejection fraction, by estimation, is 60 to 65%. The  left ventricle has normal function. The left ventricle has no regional  wall motion abnormalities. Left ventricular diastolic parameters are  consistent with Grade I diastolic  dysfunction (impaired relaxation).   2. Right ventricular systolic function is normal. The right ventricular  size is mildly enlarged. There is normal pulmonary artery systolic  pressure.   3. Left atrial size was mildly dilated.   4. The mitral valve is normal in structure. No evidence of mitral valve  regurgitation. No evidence of mitral stenosis.   5. The aortic valve is normal in structure. Aortic valve regurgitation is  trivial. No aortic stenosis is present.   6. The inferior vena cava  is normal in size with greater than 50%  respiratory variability, suggesting right atrial pressure of 3 mmHg.   Comparison(s): Prior images reviewed side by side.   FINDINGS   Left Ventricle: Left ventricular ejection fraction, by estimation, is 60  to 65%. The left ventricle has normal function. The left ventricle has no  regional wall motion abnormalities. The left ventricular internal cavity  size was normal in size. There is   no left ventricular hypertrophy. Left ventricular diastolic parameters  are consistent with Grade I diastolic dysfunction (impaired relaxation).   Right Ventricle: The right ventricular size is mildly enlarged. No  increase in right ventricular wall thickness. Right ventricular systolic  function is normal. There is normal pulmonary artery systolic pressure.  The tricuspid regurgitant velocity is 0.87   m/s, and with an assumed right atrial pressure of 3 mmHg, the estimated  right ventricular systolic pressure is 6.0 mmHg.   Left Atrium: Left atrial size was mildly dilated.   Right Atrium: Right atrial size was normal in size.   Pericardium: There is no evidence of pericardial effusion.   Mitral Valve: The mitral valve is normal in structure. No evidence of  mitral valve regurgitation. No evidence of mitral valve stenosis.   Tricuspid Valve: The tricuspid valve is normal in structure. Tricuspid  valve regurgitation is not demonstrated. No evidence of tricuspid  stenosis.   Aortic Valve: The aortic valve is normal in structure. Aortic valve  regurgitation is trivial. No aortic stenosis is present.   Pulmonic Valve: The pulmonic valve was normal in structure. Pulmonic valve  regurgitation is not visualized. No evidence of pulmonic stenosis.   Aorta: The aortic root is normal in size and structure.   Venous: The inferior vena cava is normal in size with greater than 50%  respiratory variability, suggesting right atrial pressure of 3 mmHg.    IAS/Shunts: No atrial level shunt detected by color flow Doppler.    Physical Exam .   Exam per Dr. Excell Seltzer GEN: No acute distress.   Neck: No JVD Cardiac: RRR, no murmurs, rubs, or gallops.  Respiratory: Clear to auscultation bilaterally. GI: Soft, nontender, non-distended  MS: No edema  Assessment & Plan .     Palpitations, hx of NSVT and junct rhythm on 14 day monitor 01/2023  --Recent decrease of BB XL from 50 mg to 25 mg.   Elevated Troponin/NSTEMI  --could be increase of troponin due to palpitations. --Echo stable for cardiac CTA today  CAD with hx PTCA to LAD in 1990 and on re-look per pt it had closed but had collateral circulation.   --last nuc 2023 neg for ischemia  HTN  --was on losartan but stopped due to elevated K+  --consider resuming post CTA  HLD --on zetia and atorvastatin. Continue  LDL 56 perfect  For questions or updates, please contact [facility] Please consult [email or web address] for contact info under        Signed, Nada Boozer, NP   Patient seen, examined. Available data reviewed. Agree with findings, assessment, and plan as outlined by Nada Boozer, NP.  The patient is alert, oriented, in no distress.  JVP is normal, HEENT is normal, lungs are clear bilaterally, heart is regular rate and rhythm with a soft ejection murmur at the right upper sternal border, abdomen is soft and nontender, extremities have no edema.  Right radial pulses 2+.  CT-FFR results reviewed with multivessel coronary artery disease, including the left main disease.  Recommendation for definitive evaluation with cardiac catheterization reviewed with the patient. I have reviewed the risks, indications, and alternatives to cardiac catheterization, possible angioplasty, and stenting with the patient. Risks include but are not limited to bleeding, infection, vascular injury, stroke, myocardial infection, arrhythmia, kidney injury, radiation-related injury in the case of  prolonged fluoroscopy use, emergency cardiac surgery, and death. The patient understands the risks of serious complication is 1-2 in 1000 with diagnostic cardiac cath and 1-2% or less with angioplasty/stenting.  The patient is currently feeling well and has no chest discomfort.  He has not been having exertional angina.  He presented over the weekend because of heart palpitations and was noted to have a mildly elevated high-sensitivity troponin with peak of 183.  He continues on amlodipine, aspirin, IV heparin, atorvastatin, and metoprolol.  Will place orders for cardiac catheterization tomorrow.  Tonny Bollman, M.D. 06/21/2023 2:19 PM

## 2023-06-21 NOTE — Plan of Care (Signed)
°  Problem: Coping: °Goal: Level of anxiety will decrease °Outcome: Progressing °  °

## 2023-06-21 NOTE — TOC Initial Note (Signed)
Transition of Care [facility]) - Initial/Assessment Note    Patient Details  Name: Dustin Mcclure MRN: [id number] Date of Birth: 07-10-1941  Transition of Care [facility]) CM/SW Contact:    Leone Haven, RN Phone Number: 06/21/2023, 1:21 PM  Clinical Narrative:                 From home with wife, pta self ambulatory, he has PCP  Dr. Tenny Craw, and insurance on file.  He states he currently does not have any HH services in place or DME at home. His wife will transport him home at dc and she is his support system.    Expected Discharge Plan: Home/Self Care Barriers to Discharge: No Barriers Identified   Patient Goals and CMS Choice Patient states their goals for this hospitalization and ongoing recovery are:: retrun home   Choice offered to / list presented to : NA      Expected Discharge Plan and Services In-house Referral: NA Discharge Planning Services: CM Consult Post Acute Care Choice: NA Living arrangements for the past 2 months: Single Family Home                 DME Arranged: N/A DME Agency: NA                  Prior Living Arrangements/Services Living arrangements for the past 2 months: Single Family Home Lives with:: Spouse Patient language and need for interpreter reviewed:: Yes Do you feel safe going back to the place where you live?: Yes      Need for Family Participation in Patient Care: Yes (Comment) Care giver support system in place?: Yes (comment)   Criminal Activity/Legal Involvement Pertinent to Current Situation/Hospitalization: No - Comment as needed  Activities of Daily Living Home Assistive Devices/Equipment: Eyeglasses, Dentures (specify type) (dentures upper) ADL Screening (condition at time of admission) Patient's cognitive ability adequate to safely complete daily activities?: Yes Is the patient deaf or have difficulty hearing?: No Does the patient have difficulty seeing, even when wearing glasses/contacts?: No Does the patient have  difficulty concentrating, remembering, or making decisions?: No Patient able to express need for assistance with ADLs?: Yes Does the patient have difficulty dressing or bathing?: No Independently performs ADLs?: Yes (appropriate for developmental age) Does the patient have difficulty walking or climbing stairs?: No Weakness of Legs: None Weakness of Arms/Hands: None  Permission Sought/Granted Permission sought to share information with : Case Manager Permission granted to share information with : Yes, Verbal Permission Granted              Emotional Assessment Appearance:: Appears stated age Attitude/Demeanor/Rapport: Engaged Affect (typically observed): Appropriate Orientation: : Oriented to Self, Oriented to Place, Oriented to  Time, Oriented to Situation Alcohol / Substance Use: Not Applicable Psych Involvement: No (comment)  Admission diagnosis:  Elevated troponin [R79.89] NSTEMI (non-ST elevated myocardial infarction) [place]) [I21.4] Patient Active Problem List   Diagnosis Date Noted   Elevated troponin 06/19/2023   Essential hypertension 06/19/2023   Mixed hyperlipidemia 06/19/2023   Coronary artery disease involving native coronary artery of native heart 06/19/2023   GERD without esophagitis 06/19/2023   Bilateral inguinal hernia (BIH) 06/21/2018   PCP:  Daisy Floro, MD Pharmacy:   [id number] - [address] [place] [address] Phone: [phone] Fax: [phone]     Social Determinants of Health (SDOH) Social History: SDOH Screenings   Food Insecurity: No Food Insecurity (06/20/2023)  Housing: Low Risk  (06/20/2023)  Transportation Needs: No Transportation Needs (06/20/2023)  Utilities: Not At Risk (06/20/2023)  Tobacco Use: Medium Risk (06/19/2023)   SDOH Interventions:     Readmission Risk Interventions     No data to display

## 2023-06-21 NOTE — Plan of Care (Signed)

## 2023-06-22 ENCOUNTER — Encounter (HOSPITAL_COMMUNITY)
Admission: EM | Disposition: A | Discharge status: Home / Self Care | Payer: Self-pay | Source: Home / Self Care | Attending: Cardiovascular Disease

## 2023-06-22 DIAGNOSIS — R002 Palpitations: Secondary | ICD-10-CM | POA: Diagnosis not present

## 2023-06-22 DIAGNOSIS — I10 Essential (primary) hypertension: Secondary | ICD-10-CM | POA: Diagnosis not present

## 2023-06-22 DIAGNOSIS — R7989 Other specified abnormal findings of blood chemistry: Secondary | ICD-10-CM | POA: Diagnosis not present

## 2023-06-22 DIAGNOSIS — I2510 Atherosclerotic heart disease of native coronary artery without angina pectoris: Secondary | ICD-10-CM | POA: Diagnosis not present

## 2023-06-22 HISTORY — PX: LEFT HEART CATH AND CORONARY ANGIOGRAPHY: CATH118249

## 2023-06-22 HISTORY — PX: CORONARY PRESSURE/FFR WITH 3D MAPPING: CATH118309

## 2023-06-22 LAB — CBC
HCT: 38.4 % — ABNORMAL LOW (ref 39.0–52.0)
Hemoglobin: 13 g/dL (ref 13.0–17.0)
MCH: 29.7 pg (ref 26.0–34.0)
MCHC: 33.9 g/dL (ref 30.0–36.0)
MCV: 87.9 fL (ref 80.0–100.0)
Platelets: 172 10*3/uL (ref 150–400)
RBC: 4.37 MIL/uL (ref 4.22–5.81)
RDW: 12.9 % (ref 11.5–15.5)
WBC: 5.1 10*3/uL (ref 4.0–10.5)
nRBC: 0 % (ref 0.0–0.2)

## 2023-06-22 LAB — TSH: TSH: 4.651 u[IU]/mL — ABNORMAL HIGH (ref 0.350–4.500)

## 2023-06-22 SURGERY — LEFT HEART CATH AND CORONARY ANGIOGRAPHY
Anesthesia: LOCAL

## 2023-06-22 MED ORDER — IOHEXOL 350 MG/ML SOLN
INTRAVENOUS | Status: DC | PRN
  Administered 2023-06-22: 35 mL

## 2023-06-22 MED ORDER — VERAPAMIL HCL 2.5 MG/ML IV SOLN
INTRAVENOUS | Status: DC | PRN
  Administered 2023-06-22: 10 mL via INTRA_ARTERIAL

## 2023-06-22 MED ORDER — HEPARIN SODIUM (PORCINE) 1000 UNIT/ML IJ SOLN
INTRAMUSCULAR | Status: AC
  Filled 2023-06-22: qty 10

## 2023-06-22 MED ORDER — SODIUM CHLORIDE 0.9 % IV SOLN: 250.0000 mL | INTRAVENOUS | Status: DC | PRN

## 2023-06-22 MED ORDER — HEPARIN (PORCINE) 25000 UT/250ML-% IV SOLN
900.0000 [IU]/h | INTRAVENOUS | Status: DC
  Administered 2023-06-23: 900 [IU]/h via INTRAVENOUS
  Filled 2023-06-22: qty 250

## 2023-06-22 MED ORDER — POTASSIUM CHLORIDE CRYS ER 20 MEQ PO TBCR
40.0000 meq | EXTENDED_RELEASE_TABLET | Freq: Once | ORAL | Status: AC
  Administered 2023-06-22: 40 meq via ORAL
  Filled 2023-06-22: qty 2

## 2023-06-22 MED ORDER — HEPARIN (PORCINE) IN NACL 1000-0.9 UT/500ML-% IV SOLN
INTRAVENOUS | Status: DC | PRN
  Administered 2023-06-22 (×2): 500 mL

## 2023-06-22 MED ORDER — ASPIRIN 81 MG PO TBEC
81.0000 mg | DELAYED_RELEASE_TABLET | Freq: Every day | ORAL | Status: DC
  Administered 2023-06-23: 81 mg via ORAL
  Filled 2023-06-22: qty 1

## 2023-06-22 MED ORDER — HYDRALAZINE HCL 20 MG/ML IJ SOLN: 10.0000 mg | INTRAMUSCULAR | Status: AC | PRN

## 2023-06-22 MED ORDER — ONDANSETRON HCL 4 MG/2ML IJ SOLN: 4.0000 mg | Freq: Four times a day (QID) | INTRAMUSCULAR | Status: DC | PRN

## 2023-06-22 MED ORDER — ACETAMINOPHEN 325 MG PO TABS: 650.0000 mg | ORAL_TABLET | ORAL | Status: DC | PRN

## 2023-06-22 MED ORDER — MIDAZOLAM HCL 2 MG/2ML IJ SOLN
INTRAMUSCULAR | Status: AC
  Filled 2023-06-22: qty 2

## 2023-06-22 MED ORDER — FENTANYL CITRATE (PF) 100 MCG/2ML IJ SOLN
INTRAMUSCULAR | Status: AC
  Filled 2023-06-22: qty 2

## 2023-06-22 MED ORDER — SODIUM CHLORIDE 0.9% FLUSH: 3.0000 mL | INTRAVENOUS | Status: DC | PRN

## 2023-06-22 MED ORDER — HEPARIN SODIUM (PORCINE) 1000 UNIT/ML IJ SOLN
INTRAMUSCULAR | Status: DC | PRN
  Administered 2023-06-22: 5000 [IU] via INTRAVENOUS

## 2023-06-22 MED ORDER — MIDAZOLAM HCL 2 MG/2ML IJ SOLN
INTRAMUSCULAR | Status: DC | PRN
  Administered 2023-06-22: 1 mg via INTRAVENOUS

## 2023-06-22 MED ORDER — FENTANYL CITRATE (PF) 100 MCG/2ML IJ SOLN
INTRAMUSCULAR | Status: DC | PRN
  Administered 2023-06-22: 25 ug via INTRAVENOUS

## 2023-06-22 MED ORDER — VERAPAMIL HCL 2.5 MG/ML IV SOLN
INTRAVENOUS | Status: AC
  Filled 2023-06-22: qty 2

## 2023-06-22 MED ORDER — SODIUM CHLORIDE 0.9% FLUSH
3.0000 mL | Freq: Two times a day (BID) | INTRAVENOUS | Status: DC
  Administered 2023-06-22 – 2023-06-23 (×2): 3 mL via INTRAVENOUS

## 2023-06-22 MED ORDER — LIDOCAINE HCL (PF) 1 % IJ SOLN
INTRAMUSCULAR | Status: DC | PRN
  Administered 2023-06-22: 2 mL

## 2023-06-22 MED ORDER — LIDOCAINE HCL (PF) 1 % IJ SOLN
INTRAMUSCULAR | Status: AC
  Filled 2023-06-22: qty 30

## 2023-06-22 MED ORDER — LABETALOL HCL 5 MG/ML IV SOLN: 10.0000 mg | INTRAVENOUS | Status: AC | PRN

## 2023-06-22 SURGICAL SUPPLY — 10 items
CARD KEY FFR CATHWORX (MISCELLANEOUS) IMPLANT
CATH INFINITI 5FR ANG PIGTAIL (CATHETERS) IMPLANT
CATH INFINITI AMBI 6FR TG (CATHETERS) IMPLANT
DEVICE RAD COMP TR BAND LRG (VASCULAR PRODUCTS) IMPLANT
FFR CATHWORX KEY CARD (MISCELLANEOUS) ×1
GLIDESHEATH SLEND SS 6F .021 (SHEATH) IMPLANT
KIT SINGLE USE MANIFOLD (KITS) IMPLANT
PACK CARDIAC CATHETERIZATION (CUSTOM PROCEDURE TRAY) ×2 IMPLANT
SET ATX-X65L (MISCELLANEOUS) IMPLANT
WIRE EMERALD 3MM-J .035X260CM (WIRE) IMPLANT

## 2023-06-22 NOTE — Plan of Care (Signed)
  Problem: Clinical Measurements: Goal: Cardiovascular complication will be avoided Outcome: Progressing   

## 2023-06-22 NOTE — Progress Notes (Signed)
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 It just makes my nose run a little."    Patient Measurements: Height: 5\' 8"  (172.7 cm) Weight: 74.6 kg (164 lb 7.4 oz) IBW/kg (Calculated) : 68.4 Heparin Dosing Weight: 75 kg  Vital Signs: Temp: 98.3 F (36.8 C) (07/30 0728) Temp Source: Oral (07/30 0728) BP: 135/79 (07/30 0728) Pulse Rate: 63 (07/30 0728)  Labs: Recent Labs    06/19/23 1335 06/19/23 1335 06/19/23 1644 06/19/23 1801 06/20/23 0535 06/20/23 1438 06/20/23 2014 06/21/23 0213 06/22/23 0246  HGB 14.5  --   --   --  13.3  --   --  12.4* 12.2*  HCT 43.3  --   --   --  39.4  --   --  36.9* 35.1*  PLT 197  --   --   --  180  --   --  157 152  HEPARINUNFRC  --    < >  --  <0.10* 0.22*   < > 0.34 0.71* 0.76*  CREATININE 0.67  --   --   --  0.77  --   --   --  0.71  TROPONINIHS  --   --  153* 183* 102*  --   --   --   --    < > = values in this interval not displayed.    Estimated Creatinine Clearance: 71.3 mL/min (by C-G formula based on SCr of 0.71 mg/dL).   Medications: No anticoagulants PTA  Assessment: Pt is an 73 yoM with PMH significant for CAD. Pharmacy consulted to dose heparin for ACS.  Heparin level supratherapeutic at 0.77 on 1000 units/hr.  Plans for cath  Goal of Therapy:  Heparin level 0.3-0.7 units/ml Monitor platelets by anticoagulation protocol: Yes   Plan:  Decrease heparin to 900 units/hr Daily heparin level, CBC  Harland German, PharmD Clinical Pharmacist **Pharmacist phone directory can now be found on [email or web address] ([email or web address]).  Listed under [facility].

## 2023-06-22 NOTE — Interval H&P Note (Signed)
History and Physical Interval Note:  06/22/2023 7:09 AM  Dustin Mcclure  has presented today for surgery, with the diagnosis of nstemi.  The various methods of treatment have been discussed with the patient and family. After consideration of risks, benefits and other options for treatment, the patient has consented to  Procedure(s): LEFT HEART CATH AND CORONARY ANGIOGRAPHY (N/A) as a surgical intervention.  The patient's history has been reviewed, patient examined, no change in status, stable for surgery.  I have reviewed the patient's chart and labs.  Questions were answered to the patient's satisfaction.     Orbie Pyo

## 2023-06-22 NOTE — Progress Notes (Signed)
Dr. Lynnette Caffey reached out to Darius Bump CVTS coordinator re: MVCAD/CVTS consult who acknowledged receipt of consult.

## 2023-06-22 NOTE — Consult Note (Addendum)
[address]       [place] [address]             [phone]        [name] [facility] Medical Record #[id number] Date of Birth: October 21, 1941  Referring: No ref. provider found Primary Care: Daisy Floro, MD Primary Cardiologist:Christopher Karlyne Greenspan, MD  Chief Complaint:    Chief Complaint  Patient presents with   Irregular Heart Beat    History of Present Illness:     Dustin Mcclure is an 82 year old male with a past medical history of CAD (s/p angioplasty to LAD in 1990), HTN, and HLD. He presented to the ED on 07/27 with complaints of palpitations, weakness and lightheadedness that had happened 3 days prior and lasted about 1 hour with another episode on the 27th that lasted about 3 hours which prompted him to seek care at [facility] ED. He denies chest pain, shortness of breath and dizziness. He does admit to a couple possible episodes a few weeks prior. His Troponin I (high sensitivity) while in the ED peaked at 183. EKG showed a normal sinus rhythm with no sign of acute ischemia. He was admitted to the hospital for further workup. He has a history of NSVT and junctional rhythm that was found on a 14 day heart monitor in March 2024. His nuclear scan and echo in 2023 were normal/stable. He was transferred to [facility] for further workup. CT morphology scan showed moderate to severe mixed left man and multivessel CAD and his calcium score was 4273 placing him in the 94th percentile. He then underwent heart catheterization on 07/30 which showed severe multivessel disease including 50-60% stenosis of the LAD, 80% stenosis of the proximal circumflex and chronic total occlusion of the right coronary artery collateralized by the left circumflex. Echocardiogram on 07/28 had an estimated LVEF of 60-65%, grade 1 diastolic dysfunction, and no valvular abnormalities.   He is currently chest pain free. He is retired and lives in a 2 story home with his  wife. He is active and still uses his treadmill at home often.   Current Activity/ Functional Status: Patient is independent with mobility/ambulation, transfers, ADL's, IADL's.   Zubrod Score: At the time of surgery this patient's most appropriate activity status/level should be described as: []     0    Normal activity, no symptoms [x]     1    Restricted in physical strenuous activity but ambulatory, able to do out light work []     2    Ambulatory and capable of self care, unable to do work activities, up and about                 more than 50%  Of the time                            []     3    Only limited self care, in bed greater than 50% of waking hours []     4    Completely disabled, no self care, confined to bed or chair []     5    Moribund  Past Medical History:  Diagnosis Date   CAD (coronary artery disease)    HTN (hypertension)    Hypercholesteremia     Past Surgical History:  Procedure Laterality Date   CORONARY ANGIOPLASTY  1990   mid-LAD  Social History   Tobacco Use  Smoking Status Former   Current packs/day: 0.00   Types: Cigarettes   Quit date: 1973   Years since quitting: 51.6  Smokeless Tobacco Never    Social History   Substance and Sexual Activity  Alcohol Use Yes   Comment: occationally     Allergies  Allergen Reactions   Milk-Related Compounds Other (See Comments)    "I have a mild allergy to milk, but have some daily. It just makes my nose run a little."    Current Facility-Administered Medications  Medication Dose Route Frequency Provider Last Rate Last Admin   0.9% sodium chloride infusion  1 mL/kg/hr Intravenous Continuous Leone Brand, NP 74.9 mL/hr at 06/22/23 1519 1 mL/kg/hr at 06/22/23 1519   [MAR Hold] acetaminophen (TYLENOL) tablet 650 mg  650 mg Oral Q6H PRN Shalhoub, Deno Lunger, MD       Or   Mitzi Hansen Hold] acetaminophen (TYLENOL) suppository 650 mg  650 mg Rectal Q6H PRN Shalhoub, Deno Lunger, MD       [MAR Hold] amLODipine  (NORVASC) tablet 5 mg  5 mg Oral Daily Shalhoub, Deno Lunger, MD   5 mg at 06/22/23 0800   [MAR Hold] aspirin EC tablet 81 mg  81 mg Oral Daily Dunn, Dayna N, PA-C       [MAR Hold] atorvastatin (LIPITOR) tablet 40 mg  40 mg Oral QHS Marinda Elk, MD   40 mg at 06/21/23 2126   [MAR Hold] ezetimibe (ZETIA) tablet 10 mg  10 mg Oral Daily Marinda Elk, MD   10 mg at 06/22/23 0800   [MAR Hold] finasteride (PROSCAR) tablet 5 mg  5 mg Oral Q1500 Marinda Elk, MD   5 mg at 06/21/23 1508   heparin ADULT infusion 100 units/mL (25000 units/266mL)  900 Units/hr Intravenous Continuous Silvana Newness, [facility]   Stopped at 06/22/23 1358   [MAR Hold] hydrALAZINE (APRESOLINE) injection 10 mg  10 mg Intravenous Q6H PRN Shalhoub, Deno Lunger, MD       [MAR Hold] metoprolol succinate (TOPROL-XL) 24 hr tablet 25 mg  25 mg Oral Daily Shalhoub, Deno Lunger, MD   25 mg at 06/22/23 0800   [MAR Hold] nitroGLYCERIN (NITROSTAT) SL tablet 0.4 mg  0.4 mg Sublingual Q5 min PRN Shalhoub, Deno Lunger, MD       [MAR Hold] ondansetron [facility]) tablet 4 mg  4 mg Oral Q6H PRN Shalhoub, Deno Lunger, MD       Or   Mitzi Hansen Hold] ondansetron [facility]) injection 4 mg  4 mg Intravenous Q6H PRN Shalhoub, Deno Lunger, MD       [MAR Hold] pantoprazole (PROTONIX) EC tablet 40 mg  40 mg Oral Daily Shalhoub, Deno Lunger, MD   40 mg at 06/22/23 0800   [MAR Hold] polyethylene glycol (MIRALAX / GLYCOLAX) packet 17 g  17 g Oral Daily PRN Shalhoub, Deno Lunger, MD        Medications Prior to Admission  Medication Sig Dispense Refill Last Dose   amLODipine (NORVASC) 5 MG tablet Take 1 tablet (5 mg total) by mouth daily. 30 tablet 5 06/19/2023 at am   atorvastatin (LIPITOR) 40 MG tablet Take 1 tablet (40 mg total) by mouth daily. Please schedule appointment for further refills (Patient taking differently: Take 40 mg by mouth at bedtime.) 30 tablet 1 06/18/2023 at pm   ezetimibe (ZETIA) 10 MG tablet Take 1 tablet (10 mg total) by mouth daily. 90 tablet 3 06/19/2023 at  am   famotidine (PEPCID) 20 MG tablet Take 20 mg by mouth daily as needed for heartburn or indigestion (or GERD-like symptoms).   unk   finasteride (PROSCAR) 5 MG tablet Take 5 mg by mouth daily in the afternoon.   06/18/2023   metoprolol succinate (TOPROL-XL) 50 MG 24 hr tablet Take 25 mg by mouth in the morning.   06/19/2023 at 0830   vardenafil (LEVITRA) 20 MG tablet Take 20 mg by mouth daily as needed for erectile dysfunction.   Past Month    Family History  Problem Relation Age of Onset   Heart attack Brother      Review of Systems:  Review of Systems  Constitutional:  Positive for malaise/fatigue and weight loss. Negative for chills, diaphoresis and fever.       Expected 25-30lb weight loss over a couple years  HENT:  Negative for hearing loss and tinnitus.   Eyes:        Cataracts  Respiratory:  Negative for cough, shortness of breath and wheezing.   Cardiovascular:  Positive for palpitations. Negative for chest pain, orthopnea, claudication and leg swelling.  Gastrointestinal:  Positive for heartburn. Negative for nausea and vomiting.  Genitourinary:  Negative for dysuria.       Some trouble with urination and frequent urination due to BPH  Musculoskeletal:  Negative for myalgias.       No hx of varicose veins  Neurological:  Positive for weakness. Negative for dizziness, loss of consciousness and headaches.       Lightheadedness  Endo/Heme/Allergies:  Does not bruise/bleed easily.       Allergic rhinitis  Psychiatric/Behavioral:  Negative for depression. The patient is not nervous/anxious.   Last dental visit a couple years ago, dentures  Physical Exam: BP 122/73   Pulse 69   Temp 98.3 F (36.8 C) (Oral)   Resp 19   Ht 5\' 8"  (1.727 m)   Wt 74.6 kg   SpO2 100%   BMI 25.01 kg/m   General appearance: alert, cooperative, and no distress Head: Normocephalic, without obvious abnormality, atraumatic Neck: no adenopathy, no carotid bruit, no JVD, supple, symmetrical,  trachea midline, and thyroid not enlarged, symmetric, no tenderness/mass/nodules Lymph nodes: Cervical, supraclavicular, and axillary nodes normal. Resp: clear to auscultation bilaterally Cardio: regular rate and rhythm, S1, S2 normal, no murmur, click, rub or gallop GI: soft, non-tender; bowel sounds normal; no masses,  no organomegaly Extremities: extremities normal, atraumatic, no cyanosis or edema. DP/PT pulses 2+ Neurologic: Grossly normal  Diagnostic Studies & Radiology Findings:  Dominance: Right Left Anterior Descending  Mid LAD-1 lesion is 50% stenosed.  Mid LAD-2 lesion is 60% stenosed.    Left Circumflex  Prox Cx lesion is 80% stenosed.    Right Coronary Artery  Prox RCA lesion is 99% stenosed.  Mid RCA lesion is 100% stenosed.    Third Right Posterolateral Branch  Collaterals  3rd RPL filled by collaterals from 3rd Mrg.    Collaterals  3rd RPL filled by collaterals from 3rd Mrg.        ECHOCARDIOGRAM REPORT   Patient Name:   MONDO ARAKAKI Date of Exam: 06/20/2023  Medical Rec #:  [id number]      Height:       68.0 in  Accession #:    [id number]     Weight:       166.2 lb  Date of Birth:  22-Sep-1941       BSA:  1.889 m  Patient Age:    81 years       BP:           141/81 mmHg  Patient Gender: M              HR:           68 bpm.  Exam Location:  Inpatient   Procedure: 2D Echo, Cardiac Doppler and Color Doppler   Indications:    Elevated Troponin    History:        Patient has prior history of Echocardiogram examinations,  most                 recent 02/11/2022. CAD; Risk Factors:Hypertension,  Dyslipidemia                 and Former Smoker.    Sonographer:    Aron Baba  Referring Phys: [id number]   Sonographer Comments: Suboptimal subcostal window. Image acquisition  challenging due to respiratory motion.  IMPRESSIONS   1. Left ventricular ejection fraction, by estimation, is 60 to 65%. The  left ventricle has normal function. The  left ventricle has no regional  wall motion abnormalities. Left ventricular diastolic parameters are  consistent with Grade I diastolic  dysfunction (impaired relaxation).   2. Right ventricular systolic function is normal. The right ventricular  size is mildly enlarged. There is normal pulmonary artery systolic  pressure.   3. Left atrial size was mildly dilated.   4. The mitral valve is normal in structure. No evidence of mitral valve  regurgitation. No evidence of mitral stenosis.   5. The aortic valve is normal in structure. Aortic valve regurgitation is  trivial. No aortic stenosis is present.   6. The inferior vena cava is normal in size with greater than 50%  respiratory variability, suggesting right atrial pressure of 3 mmHg.   Comparison(s): Prior images reviewed side by side.   FINDINGS   Left Ventricle: Left ventricular ejection fraction, by estimation, is 60  to 65%. The left ventricle has normal function. The left ventricle has no  regional wall motion abnormalities. The left ventricular internal cavity  size was normal in size. There is   no left ventricular hypertrophy. Left ventricular diastolic parameters  are consistent with Grade I diastolic dysfunction (impaired relaxation).   Right Ventricle: The right ventricular size is mildly enlarged. No  increase in right ventricular wall thickness. Right ventricular systolic  function is normal. There is normal pulmonary artery systolic pressure.  The tricuspid regurgitant velocity is 0.87   m/s, and with an assumed right atrial pressure of 3 mmHg, the estimated  right ventricular systolic pressure is 6.0 mmHg.   Left Atrium: Left atrial size was mildly dilated.   Right Atrium: Right atrial size was normal in size.   Pericardium: There is no evidence of pericardial effusion.   Mitral Valve: The mitral valve is normal in structure. No evidence of  mitral valve regurgitation. No evidence of mitral valve stenosis.    Tricuspid Valve: The tricuspid valve is normal in structure. Tricuspid  valve regurgitation is not demonstrated. No evidence of tricuspid  stenosis.   Aortic Valve: The aortic valve is normal in structure. Aortic valve  regurgitation is trivial. No aortic stenosis is present.   Pulmonic Valve: The pulmonic valve was normal in structure. Pulmonic valve  regurgitation is not visualized. No evidence of pulmonic stenosis.   Aorta: The aortic root is normal in size and  structure.   Venous: The inferior vena cava is normal in size with greater than 50%  respiratory variability, suggesting right atrial pressure of 3 mmHg.   IAS/Shunts: No atrial level shunt detected by color flow Doppler.     LEFT VENTRICLE  PLAX 2D  LVIDd:         4.60 cm   Diastology  LVIDs:         2.70 cm   LV e' medial:    4.68 cm/s  LV PW:         1.10 cm   LV E/e' medial:  12.1  LV IVS:        0.80 cm   LV e' lateral:   5.00 cm/s  LVOT diam:     1.90 cm   LV E/e' lateral: 11.3  LV SV:         68  LV SV Index:   36  LVOT Area:     2.84 cm     RIGHT VENTRICLE  RV S prime:     13.50 cm/s  TAPSE (M-mode): 1.3 cm   LEFT ATRIUM           Index        RIGHT ATRIUM           Index  LA diam:      3.60 cm 1.91 cm/m   RA Area:     12.20 cm  LA Vol (A2C): 67.9 ml 35.94 ml/m  RA Volume:   28.50 ml  15.09 ml/m  LA Vol (A4C): 22.7 ml 12.02 ml/m   AORTIC VALVE  LVOT Vmax:   112.00 cm/s  LVOT Vmean:  72.500 cm/s  LVOT VTI:    0.239 m    AORTA  Ao Root diam: 3.60 cm  Ao Asc diam:  3.80 cm   MITRAL VALVE               TRICUSPID VALVE  MV Area (PHT): 2.56 cm    TR Peak grad:   3.0 mmHg  MV Decel Time: 296 msec    TR Vmax:        87.30 cm/s  MR Peak grad: 2.7 mmHg  MR Vmax:      82.30 cm/s   SHUNTS  MV E velocity: 56.70 cm/s  Systemic VTI:  0.24 m  MV A velocity: 71.10 cm/s  Systemic Diam: 1.90 cm  MV E/A ratio:  0.80   Donato Schultz MD  Electronically signed by Donato Schultz MD  Signature Date/Time:  06/20/2023/2:30:33 PM      Final     Assessment and Plan: CAD/Elevated troponin (possible NSTEMI): Pt is an active 82 year old who would benefit from CABG surgery. Pt is willing to undergo surgery and seems to be a reasonable candidate. Dr. Laneta Simmers to determine ultimate candidacy and timing but he has OR availability this Thursday.  Palpitations: Hx of junction rhythm and NSVT HTN: Losartan d/c'd due to hyperkalemia. BP controlled on Amlodipine 5mg  daily and Toprol XL 25mg  daily HLD: Atorvastatin and Ezetimibe  Jenny Reichmann, PA-C   Chart reviewed, patient examined, agree with above. This 82 year old fairly active gentleman presented with an episode of tachypalpitations and weakness and had a positive troponin of 183. ECG unremarkable. Calcium scoring CT 4273 94% percentile with moderate to severe mixed LM and multivessel CAD. CT FFR showed significant stenosis in the proximal to mid LAD and proximal LCX, RCA normal. Cath showed 50% -60% mid LAD stenoses. LCX  has 80% proximal stenosis. RCA with mid occlusion, collateralized from the LCX. LVEDP 22. He denies having any CP, SOB or fatigue with low level exercise walking on his treadmill. I agree that CABG is indicated for his significant stenoses to prevent further ischemia and infarction. I discussed the operative procedure with the patient and family including alternatives, benefits and risks; including but not limited to bleeding, blood transfusion, infection, stroke, myocardial infarction, graft failure, heart block requiring a permanent pacemaker, organ dysfunction, and death.  Dustin Mcclure understands and agrees to proceed. I told him I could do surgery Thursday but he may want to go home and come back in a week or so. I am on vacation next week. I told him that I would check on my schedule for the week that I come back and let him know tomorrow morning so he can decide on the timing of surgery.  [name], MD

## 2023-06-22 NOTE — Progress Notes (Signed)
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 It just makes my nose run a little."    Patient Measurements: Height: 5\' 8"  (172.7 cm) Weight: 74.6 kg (164 lb 7.4 oz) IBW/kg (Calculated) : 68.4 Heparin Dosing Weight: 75 kg  Vital Signs: Temp: 98.3 F (36.8 C) (07/30 1155) Temp Source: Oral (07/30 1155) BP: 122/73 (07/30 1524) Pulse Rate: 69 (07/30 1524)  Labs: Recent Labs    06/19/23 1801 06/19/23 1801 06/20/23 0535 06/20/23 1438 06/20/23 2014 06/21/23 0213 06/22/23 0246 06/22/23 1653  HGB  --    < > 13.3  --   --  12.4* 12.2* 13.0  HCT  --    < > 39.4  --   --  36.9* 35.1* 38.4*  PLT  --    < > 180  --   --  157 152 172  HEPARINUNFRC <0.10*  --  0.22*   < > 0.34 0.71* 0.76*  --   CREATININE  --   --  0.77  --   --   --  0.71  --   TROPONINIHS 183*  --  102*  --   --   --   --   --    < > = values in this interval not displayed.    Estimated Creatinine Clearance: 71.3 mL/min (by C-G formula based on SCr of 0.71 mg/dL).   Medications: No anticoagulants PTA  Assessment: Pt is an 65 yoM with PMH significant for CAD. Pharmacy consulted to dose heparin for ACS. S/p LHC 7/30 noted to have significant stenosis - CT surgery consulted for revascularization.  TR band removed ~1740 per D/w RN. Pharmacy consulted to resume IV heparin 2 hours post TR band removal. Received ~5000 units during cath.   Goal of Therapy:  Heparin level 0.3-0.7 units/ml Monitor platelets by anticoagulation protocol: Yes   Plan:  Resume heparin drip at 900 units/hr 2h post TR band removal at 2000 Check heparin level in 8 hours Daily heparin level, CBC F/u CT surgery plans  Rexford Maus, PharmD, BCPS 06/22/2023 5:43 PM

## 2023-06-22 NOTE — Progress Notes (Addendum)
Progress Note  Patient Name: Dustin Mcclure Date of Encounter: 06/22/2023  Primary Cardiologist: Little Ishikawa, MD  Subjective   Feeling well, no chest pain or SOB. He had his wife had excellent questions that I did my best to answer.  Inpatient Medications    Scheduled Meds:  amLODipine  5 mg Oral Daily   [START ON 06/23/2023] aspirin  325 mg Oral Daily   atorvastatin  40 mg Oral QHS   ezetimibe  10 mg Oral Daily   finasteride  5 mg Oral Q1500   metoprolol succinate  25 mg Oral Daily   pantoprazole  40 mg Oral Daily   Continuous Infusions:  sodium chloride 1 mL/kg/hr (06/22/23 0600)   heparin 900 Units/hr (06/22/23 0924)   PRN Meds: acetaminophen **OR** acetaminophen, hydrALAZINE, nitroGLYCERIN, ondansetron **OR** ondansetron (ZOFRAN) IV, polyethylene glycol   Vital Signs    Vitals:   06/21/23 1617 06/21/23 1939 06/22/23 0426 06/22/23 0728  BP: 110/69 106/66 114/69 135/79  Pulse: (!) 56 (!) 52 (!) 57 63  Resp: 18 18 16 18   Temp: 98.2 F (36.8 C) 97.6 F (36.4 C) 97.8 F (36.6 C) 98.3 F (36.8 C)  TempSrc: Oral Oral Oral Oral  SpO2: 100% 98% 96% 99%  Weight:   74.6 kg   Height:        Intake/Output Summary (Last 24 hours) at 06/22/2023 1053 Last data filed at 06/22/2023 0429 Gross per 24 hour  Intake 414 ml  Output 1025 ml  Net -611 ml      06/22/2023    4:26 AM 06/21/2023    5:00 AM 06/20/2023    4:36 AM  Last 3 Weights  Weight (lbs) 164 lb 7.4 oz 165 lb 2 oz 166 lb 3.2 oz  Weight (kg) 74.6 kg 74.9 kg 75.388 kg     Telemetry    NSR, brief SB mid 40s around 6am otherwise HR normal - Personally Reviewed  Physical Exam   GEN: No acute distress.  HEENT: Normocephalic, atraumatic, sclera non-icteric. Neck: No JVD or bruits. Cardiac: RRR no murmurs, rubs, or gallops.  Respiratory: Clear to auscultation bilaterally. Breathing is unlabored. GI: Soft, nontender, non-distended, BS +x 4. MS: no deformity. Extremities: No clubbing or cyanosis. No  edema. Distal pedal pulses are 2+ and equal bilaterally. Neuro:  AAOx3. Follows commands. Psych:  Responds to questions appropriately with a normal affect.  Labs    High Sensitivity Troponin:   Recent Labs  Lab 06/19/23 1644 06/19/23 1801 06/20/23 0535  TROPONINIHS 153* 183* 102*      Cardiac EnzymesNo results for input(s): "TROPONINI" in the last 168 hours. No results for input(s): "TROPIPOC" in the last 168 hours.   Chemistry Recent Labs  Lab 06/19/23 1335 06/20/23 0535 06/22/23 0246  NA 136 136 134*  K 4.0 3.6 3.4*  CL 101 100 100  CO2 25 26 24   GLUCOSE 106* 95 88  BUN 15 12 17   CREATININE 0.67 0.77 0.71  CALCIUM 9.2 9.2 8.9  PROT  --  5.9*  --   ALBUMIN  --  3.5  --   AST  --  23  --   ALT  --  18  --   ALKPHOS  --  53  --   BILITOT  --  2.0*  --   GFRNONAA >60 >60 >60  ANIONGAP 10 10 10      Hematology Recent Labs  Lab 06/20/23 0535 06/21/23 0213 06/22/23 0246  WBC 6.5 5.8 5.8  RBC 4.48 4.19*  4.03*  HGB 13.3 12.4* 12.2*  HCT 39.4 36.9* 35.1*  MCV 87.9 88.1 87.1  MCH 29.7 29.6 30.3  MCHC 33.8 33.6 34.8  RDW 13.1 13.0 13.0  PLT 180 157 152    BNPNo results for input(s): "BNP", "PROBNP" in the last 168 hours.   DDimer No results for input(s): "DDIMER" in the last 168 hours.   Radiology    CT CORONARY FRACTIONAL FLOW RESERVE FLUID ANALYSIS  Result Date: 06/21/2023 EXAM: CT FFR ANALYSIS CLINICAL DATA:  NSTEMI FINDINGS: FFRct analysis was performed on the original cardiac CT angiogram dataset. Diagrammatic representation of the FFRct analysis is provided in a separate PDF document in PACS. This dictation was created using the PDF document and an interactive 3D model of the results. 3D model is not available in the EMR/PACS. Normal FFR range is >0.80. 1. Left Main:  No significant stenosis. FFR = 1.00 2. LAD: Significant stenosis. Proximal FFR = 0.79, Mid FFR = 0.74, Distal FFR = 0.56 3. D1: Borderline significant stenosis. Proximal FFR = 0.76, Distal  FFR = not modeled 4. Ramus Intermedius: Borderline significant stenosis. Proximal FFR = 0.78, Distal FFR = 0.73 (Tapering) 5. LCX: Significant stenosis. Proximal FFR = 0.78, Distal FFR = 0.61 (discrete proximal stenosis) 6. RCA: No significant stenosis. Proximal FFR = 0.98, Mid FFR = remainder of the vessel was not modeled IMPRESSION: 1. CT FFR analysis does show significant discrete stenosis (<0.75) of the proximal to mid-LAD and proximal LCx arteries. 2.  Definitive cardiac catheterization is recommended. Electronically Signed   By: Chrystie Nose M.D.   On: 06/21/2023 13:47   CT CORONARY MORPH W/CTA COR W/SCORE W/CA W/CM &/OR WO/CM  Result Date: 06/21/2023 HISTORY: 82 yo male with palpitations, no other cardiac signs/symptoms Acute coronary syndrome (ACS)/MI, assess LV function EXAM: Cardiac/Coronary CTA TECHNIQUE: The patient was scanned on a [employer]. PROTOCOL: A 120 kV prospective scan was triggered in the descending thoracic aorta at 111 HU's. Axial non-contrast 3 mm slices were carried out through the heart. The data set was analyzed on a dedicated work station and scored using the Agatson method. Gantry rotation speed was 250 msecs and collimation was .6 mm. Beta blockade and 0.8 mg of sl NTG was given. The 3D data set was reconstructed in 5% intervals of the 35-75 % of the R-R cycle. Diastolic phases were analyzed on a dedicated work station using MPR, MIP and VRT modes. The patient received OMNIPAQUE IOHEXOL 350 MG/ML SOLN contrast. FINDINGS: Quality: Good, HR 63 Coronary calcium score: The patient's coronary artery calcium score is 4273, which places the patient in the 94th percentile. Coronary arteries: Normal coronary origins.  Right dominance. Right Coronary Artery: Dominant. Diffusely calcified with moderate to likely severe proximal stenosis 70-99%. Normal R-PDA branch. The R-PLB branch is not completely visualized. Left Main Coronary Artery: Calcified with minimal 1-24%  distal stenosis. Trifurcates into the LAD, RI and LCx arteries. Left Anterior Descending Coronary Artery: Heavily calcified anterior artery that wraps around the apex. There is at least moderate mid-vessel stenosis (50-69%). Large D1 branch with mild to moderate mixed stenosis (50-69%). Ramus Intermedius Artery: Mild mixed proximal to mid-vessel stenosis (25-49%). Left Circumflex Artery: AV groove vessel that is heavily calcified - there appears to be a low-attenuation plaque of the proximal vessel with high risk features (possibly ruptured) that is severely stenotic (70-99%). There is moderate diffuse mid-vessel mixed stenosis (50-69%). Aorta: Normal size, 35 mm at the mid ascending aorta (level of the PA bifurcation)  measured double oblique. Aortic atherosclerosis. No dissection. Aortic Valve: Trileaflet. No calcifications. Other findings: Normal pulmonary vein drainage into the left atrium. Normal left atrial appendage without a thrombus. Normal size of the pulmonary artery. IMPRESSION: 1. 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 CT FFR will be performed and reported separately. 2. Coronary calcium score: The patient's coronary artery calcium score is 4273, which places the patient in the 94th percentile. 3. Normal coronary origin with right dominance. 4. Aortic atherosclerosis. 5. Definitive cardiac catheterization is recommended. Electronically Signed   By: Chrystie Nose M.D.   On: 06/21/2023 13:39   ECHOCARDIOGRAM COMPLETE  Result Date: 06/20/2023    ECHOCARDIOGRAM REPORT   Patient Name:   Dustin Mcclure Date of Exam: 06/20/2023 Medical Rec #:  [id number]      Height:       68.0 in Accession #:    [id number]     Weight:       166.2 lb Date of Birth:  [birth year]       BSA:          1.889 m Patient Age:    81 years       BP:           141/81 mmHg Patient Gender: M              HR:           68 bpm. Exam Location:  Inpatient Procedure: 2D Echo, Cardiac Doppler and Color Doppler  Indications:    Elevated Troponin  History:        Patient has prior history of Echocardiogram examinations, most                 recent 02/11/2022. CAD; Risk Factors:Hypertension, Dyslipidemia                 and Former Smoker.  Sonographer:    Aron Baba Referring Phys: [id number] Tyrone [id number]  Sonographer Comments: Suboptimal subcostal window. Image acquisition challenging due to respiratory motion. IMPRESSIONS  1. Left ventricular ejection fraction, by estimation, is 60 to 65%. The left ventricle has normal function. The left ventricle has no regional wall motion abnormalities. Left ventricular diastolic parameters are consistent with Grade I diastolic dysfunction (impaired relaxation).  2. Right ventricular systolic function is normal. The right ventricular size is mildly enlarged. There is normal pulmonary artery systolic pressure.  3. Left atrial size was mildly dilated.  4. The mitral valve is normal in structure. No evidence of mitral valve regurgitation. No evidence of mitral stenosis.  5. The aortic valve is normal in structure. Aortic valve regurgitation is trivial. No aortic stenosis is present.  6. The inferior vena cava is normal in size with greater than 50% respiratory variability, suggesting right atrial pressure of 3 mmHg. Comparison(s): Prior images reviewed side by side. FINDINGS  Left Ventricle: Left ventricular ejection fraction, by estimation, is 60 to 65%. The left ventricle has normal function. The left ventricle has no regional wall motion abnormalities. The left ventricular internal cavity size was normal in size. There is  no left ventricular hypertrophy. Left ventricular diastolic parameters are consistent with Grade I diastolic dysfunction (impaired relaxation). Right Ventricle: The right ventricular size is mildly enlarged. No increase in right ventricular wall thickness. Right ventricular systolic function is normal. There is normal pulmonary artery systolic pressure. The tricuspid  regurgitant velocity is 0.87  m/s, and with an assumed right atrial pressure of 3 mmHg, the estimated  right ventricular systolic pressure is 6.0 mmHg. Left Atrium: Left atrial size was mildly dilated. Right Atrium: Right atrial size was normal in size. Pericardium: There is no evidence of pericardial effusion. Mitral Valve: The mitral valve is normal in structure. No evidence of mitral valve regurgitation. No evidence of mitral valve stenosis. Tricuspid Valve: The tricuspid valve is normal in structure. Tricuspid valve regurgitation is not demonstrated. No evidence of tricuspid stenosis. Aortic Valve: The aortic valve is normal in structure. Aortic valve regurgitation is trivial. No aortic stenosis is present. Pulmonic Valve: The pulmonic valve was normal in structure. Pulmonic valve regurgitation is not visualized. No evidence of pulmonic stenosis. Aorta: The aortic root is normal in size and structure. Venous: The inferior vena cava is normal in size with greater than 50% respiratory variability, suggesting right atrial pressure of 3 mmHg. IAS/Shunts: No atrial level shunt detected by color flow Doppler.  LEFT VENTRICLE PLAX 2D LVIDd:         4.60 cm   Diastology LVIDs:         2.70 cm   LV e' medial:    4.68 cm/s LV PW:         1.10 cm   LV E/e' medial:  12.1 LV IVS:        0.80 cm   LV e' lateral:   5.00 cm/s LVOT diam:     1.90 cm   LV E/e' lateral: 11.3 LV SV:         68 LV SV Index:   36 LVOT Area:     2.84 cm  RIGHT VENTRICLE RV S prime:     13.50 cm/s TAPSE (M-mode): 1.3 cm LEFT ATRIUM           Index        RIGHT ATRIUM           Index LA diam:      3.60 cm 1.91 cm/m   RA Area:     12.20 cm LA Vol (A2C): 67.9 ml 35.94 ml/m  RA Volume:   28.50 ml  15.09 ml/m LA Vol (A4C): 22.7 ml 12.02 ml/m  AORTIC VALVE LVOT Vmax:   112.00 cm/s LVOT Vmean:  72.500 cm/s LVOT VTI:    0.239 m  AORTA Ao Root diam: 3.60 cm Ao Asc diam:  3.80 cm MITRAL VALVE               TRICUSPID VALVE MV Area (PHT): 2.56 cm    TR Peak  grad:   3.0 mmHg MV Decel Time: 296 msec    TR Vmax:        87.30 cm/s MR Peak grad: 2.7 mmHg MR Vmax:      82.30 cm/s   SHUNTS MV E velocity: 56.70 cm/s  Systemic VTI:  0.24 m MV A velocity: 71.10 cm/s  Systemic Diam: 1.90 cm MV E/A ratio:  0.80 Donato Schultz MD Electronically signed by Donato Schultz MD Signature Date/Time: 06/20/2023/2:30:33 PM    Final     Cardiac Studies   2d echo 06/20/23   1. Left ventricular ejection fraction, by estimation, is 60 to 65%. The  left ventricle has normal function. The left ventricle has no regional  wall motion abnormalities. Left ventricular diastolic parameters are  consistent with Grade I diastolic  dysfunction (impaired relaxation).   2. Right ventricular systolic function is normal. The right ventricular  size is mildly enlarged. There is normal pulmonary artery systolic  pressure.   3. Left atrial size  was mildly dilated.   4. The mitral valve is normal in structure. No evidence of mitral valve  regurgitation. No evidence of mitral stenosis.   5. The aortic valve is normal in structure. Aortic valve regurgitation is  trivial. No aortic stenosis is present.   6. The inferior vena cava is normal in size with greater than 50%  respiratory variability, suggesting right atrial pressure of 3 mmHg.   Comparison(s): Prior images reviewed side by side.   Patient Profile     82 y.o. male with h/o CAD with PTCA to LAD 1990, HTN, HLD, 1 brief NSVT/SVT and junctional rhythm by monitor 01/2023 presented to the hospital with rapid palpitations, had been in NSR since admission. Found to have elevated troponin and abnormal cor CTA.  Assessment & Plan    1. Palpitations - reports HR was 95bpm at time of palpitations by home monitor by BP cuff, was in NSR at time of eval here - discussed Kardia - consider OP Zio - add on TSH to labs - replete K - continue Toprol - hold off dose titration given h/o junctional rhythm as well  2. Elevated troponin, CAD - unclear  if troponin is provoked by possible arrhythmia or due to worsening underlying CAD per cor CT - for cath today - continue ASA (will change to 81mg  daily as he got this AM), statin, Zetia, Toprol  3. HTN - BP controlled, continue amlodipine - losartan previously stopped due to hyperkalemia   4. Hyperlipidemia - LDL 56, trig 41, continue atorvastatin and ezetimibe  5. Hypokalemia - pharmD repleted  For questions or updates, please contact [facility] Please consult [email or web address] for contact info under Cardiology/STEMI.  Signed, Laurann Montana, PA-C 06/22/2023, 10:53 AM    Patient seen, examined. Available data reviewed. Agree with findings, assessment, and plan as outlined by Ronie Spies, PA-C.  The patient is independently interviewed and examined.  His wife is at the bedside.  He has had no chest discomfort overnight and he feels well.  The patient is alert, oriented, elderly male in no distress.  HEENT is normal, JVP is normal, lungs are clear bilaterally, heart is regular rate and rhythm no murmur gallop, abdomen soft nontender, extremities have no edema, skin is warm and dry with no rash.  Patient for cardiac catheterization and possible PCI today.  His CT scan showed markedly elevated coronary calcium score and multivessel CAD.  His symptoms are atypical and mild.  If he does not have critical/high risk disease that may be most appropriate to treat him medically.  However, he understands the need to evaluate for severe left main disease or critical stenosis in light of his elevated troponin and cardiac CTA findings.  Tonny Bollman, M.D. 06/22/2023 11:36 AM

## 2023-06-22 NOTE — Consult Note (Signed)
   [facility] CM Inpatient Consult   06/22/2023  Ryo Garr Aug 27, 1941 [id number]  [facility] [[facility]]  Accountable Care Organization [ACO] Patient: [employer] Medicare  Primary Care Provider:  Daisy Floro, MD with Deboraha Sprang at [facility]   Patient screened for hospitalization with noted review for post hospital transition for care coordination.  Review of patient's electronic medical record reveals patient is post procedure.   Plan:  Continue to follow progress and disposition to assess for post hospital community care coordination/management needs.  Referral request for community care coordination: pending needs with [facility] provider.  Of note, [facility] Care Management/Population Health does not replace or interfere with any arrangements made by the Inpatient Transition of Care team.  For questions contact:   Charlesetta Shanks, RN BSN CCM [facility]  [phone] business mobile phone Toll free office [phone]  *Concierge Line  [phone] Fax number: [phone] [email or web address]

## 2023-06-23 ENCOUNTER — Encounter (HOSPITAL_COMMUNITY): Payer: Self-pay | Admitting: Internal Medicine

## 2023-06-23 ENCOUNTER — Encounter: Payer: Self-pay | Admitting: *Deleted

## 2023-06-23 ENCOUNTER — Other Ambulatory Visit: Payer: Self-pay | Admitting: *Deleted

## 2023-06-23 ENCOUNTER — Other Ambulatory Visit (HOSPITAL_COMMUNITY): Payer: Medicare Other

## 2023-06-23 DIAGNOSIS — I2510 Atherosclerotic heart disease of native coronary artery without angina pectoris: Secondary | ICD-10-CM | POA: Diagnosis not present

## 2023-06-23 DIAGNOSIS — R7989 Other specified abnormal findings of blood chemistry: Secondary | ICD-10-CM | POA: Diagnosis not present

## 2023-06-23 DIAGNOSIS — R002 Palpitations: Secondary | ICD-10-CM | POA: Insufficient documentation

## 2023-06-23 MED ORDER — ASPIRIN 81 MG PO TBEC: 81.0000 mg | DELAYED_RELEASE_TABLET | Freq: Every day | ORAL | 11 refills | Status: DC

## 2023-06-23 MED ORDER — ATORVASTATIN CALCIUM 40 MG PO TABS: 40.0000 mg | ORAL_TABLET | Freq: Every day | ORAL | 5 refills | Status: DC

## 2023-06-23 MED ORDER — NITROGLYCERIN 0.4 MG SL SUBL: 0.4000 mg | SUBLINGUAL_TABLET | SUBLINGUAL | 3 refills | Status: DC | PRN

## 2023-06-23 MED ORDER — ATORVASTATIN CALCIUM 40 MG PO TABS: 40.0000 mg | ORAL_TABLET | Freq: Every day | ORAL | 5 refills | Status: AC

## 2023-06-23 NOTE — Progress Notes (Signed)
1 Day Post-Op Procedure(s) (LRB): LEFT HEART CATH AND CORONARY ANGIOGRAPHY (N/A) Coronary Pressure/FFR w/3D Mapping (N/A) Subjective: Feels fine. No symptoms. Wants to go home.  Objective: Vital signs in last 24 hours: Temp:  [97.6 F (36.4 C)-98.3 F (36.8 C)] 97.6 F (36.4 C) (07/31 0347) Pulse Rate:  [50-80] 56 (07/31 0347) Cardiac Rhythm: Normal sinus rhythm (07/31 0736) Resp:  [13-24] 19 (07/31 0347) BP: (111-146)/(64-79) 135/70 (07/31 0347) SpO2:  [92 %-100 %] 97 % (07/31 0347) Weight:  [74.8 kg] 74.8 kg (07/31 0341)  Hemodynamic parameters for last 24 hours:    Intake/Output from previous day: 07/30 0701 - 07/31 0700 In: 1212.5 [I.V.:1212.5] Out: 1500 [Urine:1500] Intake/Output this shift: No intake/output data recorded.  General appearance: alert and cooperative Heart: regular rate and rhythm Lungs: clear to auscultation bilaterally  Lab Results: Recent Labs    06/22/23 0246 06/22/23 1653  WBC 5.8 5.1  HGB 12.2* 13.0  HCT 35.1* 38.4*  PLT 152 172   BMET:  Recent Labs    06/22/23 0246 06/23/23 0344  NA 134* 134*  K 3.4* 3.6  CL 100 100  CO2 24 25  GLUCOSE 88 90  BUN 17 15  CREATININE 0.71 0.76  CALCIUM 8.9 8.8*    PT/INR: No results for input(s): "LABPROT", "INR" in the last 72 hours. ABG No results found for: "PHART", "HCO3", "TCO2", "ACIDBASEDEF", "O2SAT" CBG (last 3)  No results for input(s): "GLUCAP" in the last 72 hours.  Assessment/Plan:  3V CAD. He will benefit from CABG. He would like to go home and come back for surgery on Thursday 8/15. I think this should be fine since his stenoses are not critical and he has not had any CP or SOB. I will have Ryan call him at home to schedule preadmit and surgery. He is in agreement. Waiting for cardiology to see him this am.   LOS: 2 days    [name] 06/23/2023

## 2023-06-23 NOTE — Plan of Care (Signed)
  Problem: Education: Goal: Knowledge of General Education information will improve Description Including pain rating scale, medication(s)/side effects and non-pharmacologic comfort measures Outcome: Progressing   

## 2023-06-23 NOTE — Progress Notes (Addendum)
Progress Note  Patient Name: Dustin Mcclure Date of Encounter: 06/23/2023  Primary Cardiologist: Little Ishikawa, MD  Subjective   Feeling great, no CP or SOB. No palpitations. Telemetry stable overnight. Hopeful for DC.  Cardiac rehab phase 1 aware to see today.  Inpatient Medications    Scheduled Meds:  amLODipine  5 mg Oral Daily   aspirin EC  81 mg Oral Daily   atorvastatin  40 mg Oral QHS   ezetimibe  10 mg Oral Daily   finasteride  5 mg Oral Q1500   metoprolol succinate  25 mg Oral Daily   pantoprazole  40 mg Oral Daily   sodium chloride flush  3 mL Intravenous Q12H   Continuous Infusions:  sodium chloride     heparin 900 Units/hr (06/23/23 0716)   PRN Meds: sodium chloride, acetaminophen, hydrALAZINE, nitroGLYCERIN, ondansetron **OR** ondansetron (ZOFRAN) IV, polyethylene glycol, sodium chloride flush   Vital Signs    Vitals:   06/23/23 0003 06/23/23 0341 06/23/23 0347 06/23/23 0851  BP: 123/67  135/70 123/65  Pulse: 63  (!) 56 62  Resp: 18  19 18   Temp: 97.6 F (36.4 C)  97.6 F (36.4 C) 97.6 F (36.4 C)  TempSrc: Oral  Oral Oral  SpO2: 95%  97% 97%  Weight:  74.8 kg    Height:        Intake/Output Summary (Last 24 hours) at 06/23/2023 1006 Last data filed at 06/23/2023 0853 Gross per 24 hour  Intake 611.1 ml  Output 1700 ml  Net -1088.9 ml      06/23/2023    3:41 AM 06/22/2023    4:26 AM 06/21/2023    5:00 AM  Last 3 Weights  Weight (lbs) 164 lb 14.5 oz 164 lb 7.4 oz 165 lb 2 oz  Weight (kg) 74.8 kg 74.6 kg 74.9 kg     Telemetry    NSR (brief brady mid 40s during sleeping hours otherwise normal HR during wakeful hours) - Personally Reviewed  Physical Exam   GEN: No acute distress.  HEENT: Normocephalic, atraumatic, sclera non-icteric. Neck: No JVD or bruits. Cardiac: RRR no murmurs, rubs, or gallops.  Respiratory: Clear to auscultation bilaterally. Breathing is unlabored. GI: Soft, nontender, non-distended, BS +x 4. MS: no  deformity. Extremities: No clubbing or cyanosis. No edema. Distal pedal pulses are 2+ and equal bilaterally. Right radial cath site with mild soft ecchymosis but no hematoma, good pulse. Neuro:  AAOx3. Follows commands. Psych:  Responds to questions appropriately with a normal affect.  Labs    High Sensitivity Troponin:   Recent Labs  Lab 06/19/23 1644 06/19/23 1801 06/20/23 0535  TROPONINIHS 153* 183* 102*      Cardiac EnzymesNo results for input(s): "TROPONINI" in the last 168 hours. No results for input(s): "TROPIPOC" in the last 168 hours.   Chemistry Recent Labs  Lab 06/20/23 0535 06/22/23 0246 06/23/23 0344  NA 136 134* 134*  K 3.6 3.4* 3.6  CL 100 100 100  CO2 26 24 25   GLUCOSE 95 88 90  BUN 12 17 15   CREATININE 0.77 0.71 0.76  CALCIUM 9.2 8.9 8.8*  PROT 5.9*  --   --   ALBUMIN 3.5  --   --   AST 23  --   --   ALT 18  --   --   ALKPHOS 53  --   --   BILITOT 2.0*  --   --   GFRNONAA >60 >60 >60  ANIONGAP 10 10 9  Hematology Recent Labs  Lab 06/21/23 0213 06/22/23 0246 06/22/23 1653  WBC 5.8 5.8 5.1  RBC 4.19* 4.03* 4.37  HGB 12.4* 12.2* 13.0  HCT 36.9* 35.1* 38.4*  MCV 88.1 87.1 87.9  MCH 29.6 30.3 29.7  MCHC 33.6 34.8 33.9  RDW 13.0 13.0 12.9  PLT 157 152 172    BNPNo results for input(s): "BNP", "PROBNP" in the last 168 hours.   DDimer No results for input(s): "DDIMER" in the last 168 hours.   Radiology    CARDIAC CATHETERIZATION  Result Date: 06/22/2023   Mid LAD-1 lesion is 50% stenosed.   Mid LAD-2 lesion is 60% stenosed.   Prox RCA lesion is 99% stenosed.   Mid RCA lesion is 100% stenosed.   Prox Cx lesion is 80% stenosed. 1.  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 2.  LVEDP of 22 mmHg. Recommendation: Surgical revascularization.   CT CORONARY FRACTIONAL FLOW RESERVE  FLUID ANALYSIS  Result Date: 06/21/2023 EXAM: CT FFR ANALYSIS CLINICAL DATA:  NSTEMI FINDINGS: FFRct analysis was performed on the original cardiac CT angiogram dataset. Diagrammatic representation of the FFRct analysis is provided in a separate PDF document in PACS. This dictation was created using the PDF document and an interactive 3D model of the results. 3D model is not available in the EMR/PACS. Normal FFR range is >0.80. 1. Left Main:  No significant stenosis. FFR = 1.00 2. LAD: Significant stenosis. Proximal FFR = 0.79, Mid FFR = 0.74, Distal FFR = 0.56 3. D1: Borderline significant stenosis. Proximal FFR = 0.76, Distal FFR = not modeled 4. Ramus Intermedius: Borderline significant stenosis. Proximal FFR = 0.78, Distal FFR = 0.73 (Tapering) 5. LCX: Significant stenosis. Proximal FFR = 0.78, Distal FFR = 0.61 (discrete proximal stenosis) 6. RCA: No significant stenosis. Proximal FFR = 0.98, Mid FFR = remainder of the vessel was not modeled IMPRESSION: 1. CT FFR analysis does show significant discrete stenosis (<0.75) of the proximal to mid-LAD and proximal LCx arteries. 2.  Definitive cardiac catheterization is recommended. Electronically Signed   By: Chrystie Nose M.D.   On: 06/21/2023 13:47   CT CORONARY MORPH W/CTA COR W/SCORE W/CA W/CM &/OR WO/CM  Result Date: 06/21/2023 HISTORY: 82 yo male with palpitations, no other cardiac signs/symptoms Acute coronary syndrome (ACS)/MI, assess LV function EXAM: Cardiac/Coronary CTA TECHNIQUE: The patient was scanned on a [employer]. PROTOCOL: A 120 kV prospective scan was triggered in the descending thoracic aorta at 111 HU's. Axial non-contrast 3 mm slices were carried out through the heart. The data set was analyzed on a dedicated work station and scored using the Agatson method. Gantry rotation speed was 250 msecs and collimation was .6 mm. Beta blockade and 0.8 mg of sl NTG was given. The 3D data set was reconstructed in 5% intervals of the  35-75 % of the R-R cycle. Diastolic phases were analyzed on a dedicated work station using MPR, MIP and VRT modes. The patient received OMNIPAQUE IOHEXOL 350 MG/ML SOLN contrast. FINDINGS: Quality: Good, HR 63 Coronary calcium score: The patient's coronary artery calcium score is 4273, which places the patient in the 94th percentile. Coronary arteries: Normal coronary origins.  Right dominance. Right Coronary Artery: Dominant. Diffusely calcified with moderate to likely severe proximal stenosis 70-99%. Normal R-PDA branch. The R-PLB branch is not completely visualized. Left Main Coronary Artery: Calcified with minimal 1-24% distal stenosis.  Trifurcates into the LAD, RI and LCx arteries. Left Anterior Descending Coronary Artery: Heavily calcified anterior artery that wraps around the apex. There is at least moderate mid-vessel stenosis (50-69%). Large D1 branch with mild to moderate mixed stenosis (50-69%). Ramus Intermedius Artery: Mild mixed proximal to mid-vessel stenosis (25-49%). Left Circumflex Artery: AV groove vessel that is heavily calcified - there appears to be a low-attenuation plaque of the proximal vessel with high risk features (possibly ruptured) that is severely stenotic (70-99%). There is moderate diffuse mid-vessel mixed stenosis (50-69%). Aorta: Normal size, 35 mm at the mid ascending aorta (level of the PA bifurcation) measured double oblique. Aortic atherosclerosis. No dissection. Aortic Valve: Trileaflet. No calcifications. Other findings: Normal pulmonary vein drainage into the left atrium. Normal left atrial appendage without a thrombus. Normal size of the pulmonary artery. IMPRESSION: 1. 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 CT FFR will be performed and reported separately. 2. Coronary calcium score: The patient's coronary artery calcium score is 4273, which places the patient in the 94th percentile. 3. Normal coronary origin with right dominance. 4.  Aortic atherosclerosis. 5. Definitive cardiac catheterization is recommended. Electronically Signed   By: Chrystie Nose M.D.   On: 06/21/2023 13:39    Cardiac Studies    2d echo 06/20/23   1. Left ventricular ejection fraction, by estimation, is 60 to 65%. The  left ventricle has normal function. The left ventricle has no regional  wall motion abnormalities. Left ventricular diastolic parameters are  consistent with Grade I diastolic  dysfunction (impaired relaxation).   2. Right ventricular systolic function is normal. The right ventricular  size is mildly enlarged. There is normal pulmonary artery systolic  pressure.   3. Left atrial size was mildly dilated.   4. The mitral valve is normal in structure. No evidence of mitral valve  regurgitation. No evidence of mitral stenosis.   5. The aortic valve is normal in structure. Aortic valve regurgitation is  trivial. No aortic stenosis is present.   6. The inferior vena cava is normal in size with greater than 50%  respiratory variability, suggesting right atrial pressure of 3 mmHg.   Comparison(s): Prior images reviewed side by side.     Patient Profile     82 y.o. male with h/o CAD with PTCA to LAD 1990, HTN, HLD, 1 brief NSVT/SVT and junctional rhythm by monitor 01/2023 presented to the hospital with rapid palpitations, had been in NSR since admission. Found to have elevated troponin and abnormal cor CTA.   Assessment & Plan    1. Palpitations - reports HR was 95bpm at time of palpitations by home monitor by BP cuff, was in NSR at time of eval here - discussed Kardia with patient - will discuss ? Monitor with MD - no arrhythmias here - will need f/u PCP for abnormal TSH, slightly elevated - continue Toprol - hold off dose titration given h/o junctional rhythm as well   2. 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 HTN - BP controlled, continue amlodipine - losartan previously stopped due to hyperkalemia    4. Hyperlipidemia - LDL 56, trig 41, continue atorvastatin and ezetimibe   5. Hypokalemia - pharmD repleted  For questions or updates, please contact [facility] Please consult [email or web address] for contact info under Cardiology/STEMI.  Signed, Laurann Montana, PA-C 06/23/2023, 10:06 AM    Patient seen, examined. Available data reviewed. Agree with findings, assessment, and plan as outlined by Ronie Spies, PA-C.  On exam patient no distress.  HEENT is normal, lungs are clear, heart is regular rate and rhythm without murmur gallop, abdomen soft nontender, extremities have no edema, right radial site has a dressing in place but there is no hematoma or ecchymoses visible.  Patient's coronary angiogram is reviewed and he has multivessel disease with chronic total occlusion of the RCA, severe left circumflex stenosis, and moderate but hemodynamically significant LAD stenosis.  He will undergo CABG by Dr. Laneta Simmers as an outpatient in a few weeks.  I am going to stop his IV heparin.  He has not had any angina since he has been in the hospital.  I think he is medically stable for discharge on the medical program as outlined above.  We discussed an outpatient cardiac monitor but agreed that this is not necessary at this time.  He will be contacted by the surgical team for appropriate preoperative testing before CABG.  Tonny Bollman, M.D. 06/23/2023 12:15 PM

## 2023-06-23 NOTE — Discharge Summary (Addendum)
Discharge Summary    Patient ID: Dustin Mcclure MRN: [id number]; DOB: 26-Jan-1941  Admit date: 06/19/2023 Discharge date: 06/23/2023  PCP:  Daisy Floro, MD   [facility] Providers Cardiologist:  Little Ishikawa, MD        Discharge Diagnoses    Principal Problem:   Elevated troponin Active Problems:   Essential hypertension   Mixed hyperlipidemia   Coronary artery disease involving native coronary artery of native heart   GERD without esophagitis   Palpitations    Diagnostic Studies/Procedures    2d echo 06/20/23   1. Left ventricular ejection fraction, by estimation, is 60 to 65%. The  left ventricle has normal function. The left ventricle has no regional  wall motion abnormalities. Left ventricular diastolic parameters are  consistent with Grade I diastolic  dysfunction (impaired relaxation).   2. Right ventricular systolic function is normal. The right ventricular  size is mildly enlarged. There is normal pulmonary artery systolic  pressure.   3. Left atrial size was mildly dilated.   4. The mitral valve is normal in structure. No evidence of mitral valve  regurgitation. No evidence of mitral stenosis.   5. The aortic valve is normal in structure. Aortic valve regurgitation is  trivial. No aortic stenosis is present.   6. The inferior vena cava is normal in size with greater than 50%  respiratory variability, suggesting right atrial pressure of 3 mmHg.   Comparison(s): Prior images reviewed side by side  LHC 06/22/23    Mid LAD-1 lesion is 50% stenosed.   Mid LAD-2 lesion is 60% stenosed.   Prox RCA lesion is 99% stenosed.   Mid RCA lesion is 100% stenosed.   Prox Cx lesion is 80% stenosed.   1.  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 2.  LVEDP of 22 mmHg.    Recommendation: Surgical revascularization. _____________   History of Present Illness     Dustin Mcclure is a 82 y.o. male with with PTCA to LAD 1990, HTN, HLD, 1 brief NSVT/SVT and junctional rhythm by monitor 01/2023 presented to the hospital with rapid palpitations. He initially presented to [facility] with 3 hours of sense of palpitations with HR 90s at home. At [facility] he was in NSR. Troponins were elevated at 153-183-102. He was seen by our cardiology team who recommended echocardiogram and coronary CTA. Coronary CTA was abnormal so transferred to [facility] for cath. Marland Kitchen   Hospital Course     1. Palpitations - reports HR was 95bpm at time of palpitations by home monitor by BP cuff, was in NSR at time of eval here - discussed Kardia with patient - per Dr. Excell Seltzer, no need for monitor for now  - recommended f/u PCP for abnormal TSH, slightly elevated - continue Toprol - hold off dose titration given h/o junctional rhythm as well   2. Elevated troponin, multivessel CAD - mildly elevated troponin noted (per d/w Dr. Excell Seltzer, not characterizing as NSTEMI at this time) - had abnormal coronary CTA prompting cath - cath showing severe multivessel disease with chronic total occlusion of right coronary artery collateralized by left circumflex system, high-grade proximal left circumflex lesion, and FFR angiography positive LAD disease, LVEDP - seen by CVTS, felt to benefit from CABG - since patient had remained clinically stable without any angina since he's been in the hospital, he was felt clinically stable  for discharge to return for CABG 8/15 - he will be contacted by surgical team for appropriate preoperative testing before CABG. Per Dr. Excell Seltzer, no need to schedule [facility] f/u pre-CABG but certainly anticipate post-hospital f/u will be requested post procedure per usual protoocl - continue ASA 81mg , statin, Zetia, Toprol, amlodipine - no Plavix given plan for CABG - patient aware to refrain from heavy  exertion, seek care for symptoms in the interim - also advised to hold off vardenafil in case of need to use SL NTG, advised to discuss timing of resumption with surgeon after surgery   3. HTN - BP controlled, continue amlodipine and metoprolol - losartan previously stopped due to hyperkalemia per notes   4. Hyperlipidemia - LDL 56, trig 41, continue atorvastatin and ezetimibe - refills of atorvastatin were sent in (dose was not changed, just removed comment that patient needs OV for refills)   5. Hypokalemia - pharmD repleted, improved  6. Hyponatremia - Na just 1 pt below normal, similar values in the past as well - anticipate following post-operatively  Dr. Excell Seltzer has seen and examined the patient today and feels he is stable for discharge. Discharge instructions outlined below. Per Dr. Excell Seltzer, OK to resume driving tomorrow.  Pt declined TOC pharmacy so ASA, SL NTG, refills of atorvastatin sent to usual pharmacy.       Did the patient have an acute coronary syndrome (MI, NSTEMI, STEMI, etc) this admission?:  No                               Did the patient have a percutaneous coronary intervention (stent / angioplasty)?:  No.    _____________  Discharge Vitals Blood pressure 123/65, pulse 62, temperature 97.6 F (36.4 C), temperature source Oral, resp. rate 18, height 5\' 8"  (1.727 m), weight 74.8 kg, SpO2 97%.  Filed Weights   06/21/23 0500 06/22/23 0426 06/23/23 0341  Weight: 74.9 kg 74.6 kg 74.8 kg    Labs & Radiologic Studies    CBC Recent Labs    06/22/23 0246 06/22/23 1653  WBC 5.8 5.1  HGB 12.2* 13.0  HCT 35.1* 38.4*  MCV 87.1 87.9  PLT 152 172   Basic Metabolic Panel Recent Labs    13/08/65 0246 06/23/23 0344  NA 134* 134*  K 3.4* 3.6  CL 100 100  CO2 24 25  GLUCOSE 88 90  BUN 17 15  CREATININE 0.71 0.76  CALCIUM 8.9 8.8*   Liver Function Tests No results for input(s): "AST", "ALT", "ALKPHOS", "BILITOT", "PROT", "ALBUMIN" in the last 72  hours. No results for input(s): "LIPASE", "AMYLASE" in the last 72 hours. High Sensitivity Troponin:   Recent Labs  Lab 06/19/23 1644 06/19/23 1801 06/20/23 0535  TROPONINIHS 153* 183* 102*    BNP Invalid input(s): "POCBNP" D-Dimer No results for input(s): "DDIMER" in the last 72 hours. Hemoglobin A1C No results for input(s): "HGBA1C" in the last 72 hours. Fasting Lipid Panel No results for input(s): "CHOL", "HDL", "LDLCALC", "TRIG", "CHOLHDL", "LDLDIRECT" in the last 72 hours. Thyroid Function Tests Recent Labs    06/22/23 0355  TSH 4.651*   _____________  CARDIAC CATHETERIZATION  Result Date: 06/22/2023   Mid LAD-1 lesion is 50% stenosed.   Mid LAD-2 lesion is 60% stenosed.   Prox RCA lesion is 99% stenosed.   Mid RCA lesion is 100% stenosed.   Prox Cx lesion is 80% stenosed. 1.  Severe multivessel disease with chronic  total occlusion of right coronary artery collateralized by left circumflex system, high-grade proximal left circumflex lesion, and FFR angiography positive LAD disease. 2.  LVEDP of 22 mmHg. Recommendation: Surgical revascularization.   CT CORONARY FRACTIONAL FLOW RESERVE FLUID ANALYSIS  Result Date: 06/21/2023 EXAM: CT FFR ANALYSIS CLINICAL DATA:  NSTEMI FINDINGS: FFRct analysis was performed on the original cardiac CT angiogram dataset. Diagrammatic representation of the FFRct analysis is provided in a separate PDF document in PACS. This dictation was created using the PDF document and an interactive 3D model of the results. 3D model is not available in the EMR/PACS. Normal FFR range is >0.80. 1. Left Main:  No significant stenosis. FFR = 1.00 2. LAD: Significant stenosis. Proximal FFR = 0.79, Mid FFR = 0.74, Distal FFR = 0.56 3. D1: Borderline significant stenosis. Proximal FFR = 0.76, Distal FFR = not modeled 4. Ramus Intermedius: Borderline significant stenosis. Proximal FFR = 0.78, Distal FFR = 0.73 (Tapering) 5. LCX: Significant stenosis. Proximal FFR = 0.78,  Distal FFR = 0.61 (discrete proximal stenosis) 6. RCA: No significant stenosis. Proximal FFR = 0.98, Mid FFR = remainder of the vessel was not modeled IMPRESSION: 1. CT FFR analysis does show significant discrete stenosis (<0.75) of the proximal to mid-LAD and proximal LCx arteries. 2.  Definitive cardiac catheterization is recommended. Electronically Signed   By: Chrystie Nose M.D.   On: 06/21/2023 13:47   CT CORONARY MORPH W/CTA COR W/SCORE W/CA W/CM &/OR WO/CM  Result Date: 06/21/2023 HISTORY: 82 yo male with palpitations, no other cardiac signs/symptoms Acute coronary syndrome (ACS)/MI, assess LV function EXAM: Cardiac/Coronary CTA TECHNIQUE: The patient was scanned on a [employer]. PROTOCOL: A 120 kV prospective scan was triggered in the descending thoracic aorta at 111 HU's. Axial non-contrast 3 mm slices were carried out through the heart. The data set was analyzed on a dedicated work station and scored using the Agatson method. Gantry rotation speed was 250 msecs and collimation was .6 mm. Beta blockade and 0.8 mg of sl NTG was given. The 3D data set was reconstructed in 5% intervals of the 35-75 % of the R-R cycle. Diastolic phases were analyzed on a dedicated work station using MPR, MIP and VRT modes. The patient received OMNIPAQUE IOHEXOL 350 MG/ML SOLN contrast. FINDINGS: Quality: Good, HR 63 Coronary calcium score: The patient's coronary artery calcium score is 4273, which places the patient in the 94th percentile. Coronary arteries: Normal coronary origins.  Right dominance. Right Coronary Artery: Dominant. Diffusely calcified with moderate to likely severe proximal stenosis 70-99%. Normal R-PDA branch. The R-PLB branch is not completely visualized. Left Main Coronary Artery: Calcified with minimal 1-24% distal stenosis. Trifurcates into the LAD, RI and LCx arteries. Left Anterior Descending Coronary Artery: Heavily calcified anterior artery that wraps around the apex. There is  at least moderate mid-vessel stenosis (50-69%). Large D1 branch with mild to moderate mixed stenosis (50-69%). Ramus Intermedius Artery: Mild mixed proximal to mid-vessel stenosis (25-49%). Left Circumflex Artery: AV groove vessel that is heavily calcified - there appears to be a low-attenuation plaque of the proximal vessel with high risk features (possibly ruptured) that is severely stenotic (70-99%). There is moderate diffuse mid-vessel mixed stenosis (50-69%). Aorta: Normal size, 35 mm at the mid ascending aorta (level of the PA bifurcation) measured double oblique. Aortic atherosclerosis. No dissection. Aortic Valve: Trileaflet. No calcifications. Other findings: Normal pulmonary vein drainage into the left atrium. Normal left atrial appendage without a thrombus. Normal size of the pulmonary artery. IMPRESSION:  1. 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 CT FFR will be performed and reported separately. 2. Coronary calcium score: The patient's coronary artery calcium score is 4273, which places the patient in the 94th percentile. 3. Normal coronary origin with right dominance. 4. Aortic atherosclerosis. 5. Definitive cardiac catheterization is recommended. Electronically Signed   By: Chrystie Nose M.D.   On: 06/21/2023 13:39   ECHOCARDIOGRAM COMPLETE  Result Date: 06/20/2023    ECHOCARDIOGRAM REPORT   Patient Name:   Dustin CANJURA Date of Exam: 06/20/2023 Medical Rec #:  [id number]      Height:       68.0 in Accession #:    [id number]     Weight:       166.2 lb Date of Birth:  [birth year]       BSA:          1.889 m Patient Age:    81 years       BP:           141/81 mmHg Patient Gender: M              HR:           68 bpm. Exam Location:  Inpatient Procedure: 2D Echo, Cardiac Doppler and Color Doppler Indications:    Elevated Troponin  History:        Patient has prior history of Echocardiogram examinations, most                 recent 02/11/2022. CAD; Risk Factors:Hypertension,  Dyslipidemia                 and Former Smoker.  Sonographer:    Aron Baba Referring Phys: [id number] [id number]  Sonographer Comments: Suboptimal subcostal window. Image acquisition challenging due to respiratory motion. IMPRESSIONS  1. Left ventricular ejection fraction, by estimation, is 60 to 65%. The left ventricle has normal function. The left ventricle has no regional wall motion abnormalities. Left ventricular diastolic parameters are consistent with Grade I diastolic dysfunction (impaired relaxation).  2. Right ventricular systolic function is normal. The right ventricular size is mildly enlarged. There is normal pulmonary artery systolic pressure.  3. Left atrial size was mildly dilated.  4. The mitral valve is normal in structure. No evidence of mitral valve regurgitation. No evidence of mitral stenosis.  5. The aortic valve is normal in structure. Aortic valve regurgitation is trivial. No aortic stenosis is present.  6. The inferior vena cava is normal in size with greater than 50% respiratory variability, suggesting right atrial pressure of 3 mmHg. Comparison(s): Prior images reviewed side by side. FINDINGS  Left Ventricle: Left ventricular ejection fraction, by estimation, is 60 to 65%. The left ventricle has normal function. The left ventricle has no regional wall motion abnormalities. The left ventricular internal cavity size was normal in size. There is  no left ventricular hypertrophy. Left ventricular diastolic parameters are consistent with Grade I diastolic dysfunction (impaired relaxation). Right Ventricle: The right ventricular size is mildly enlarged. No increase in right ventricular wall thickness. Right ventricular systolic function is normal. There is normal pulmonary artery systolic pressure. The tricuspid regurgitant velocity is 0.87  m/s, and with an assumed right atrial pressure of 3 mmHg, the estimated right ventricular systolic pressure is 6.0 mmHg. Left Atrium: Left atrial size was  mildly dilated. Right Atrium: Right atrial size was normal in size. Pericardium: There is no evidence of pericardial effusion. Mitral Valve: The mitral  valve is normal in structure. No evidence of mitral valve regurgitation. No evidence of mitral valve stenosis. Tricuspid Valve: The tricuspid valve is normal in structure. Tricuspid valve regurgitation is not demonstrated. No evidence of tricuspid stenosis. Aortic Valve: The aortic valve is normal in structure. Aortic valve regurgitation is trivial. No aortic stenosis is present. Pulmonic Valve: The pulmonic valve was normal in structure. Pulmonic valve regurgitation is not visualized. No evidence of pulmonic stenosis. Aorta: The aortic root is normal in size and structure. Venous: The inferior vena cava is normal in size with greater than 50% respiratory variability, suggesting right atrial pressure of 3 mmHg. IAS/Shunts: No atrial level shunt detected by color flow Doppler.  LEFT VENTRICLE PLAX 2D LVIDd:         4.60 cm   Diastology LVIDs:         2.70 cm   LV e' medial:    4.68 cm/s LV PW:         1.10 cm   LV E/e' medial:  12.1 LV IVS:        0.80 cm   LV e' lateral:   5.00 cm/s LVOT diam:     1.90 cm   LV E/e' lateral: 11.3 LV SV:         68 LV SV Index:   36 LVOT Area:     2.84 cm  RIGHT VENTRICLE RV S prime:     13.50 cm/s TAPSE (M-mode): 1.3 cm LEFT ATRIUM           Index        RIGHT ATRIUM           Index LA diam:      3.60 cm 1.91 cm/m   RA Area:     12.20 cm LA Vol (A2C): 67.9 ml 35.94 ml/m  RA Volume:   28.50 ml  15.09 ml/m LA Vol (A4C): 22.7 ml 12.02 ml/m  AORTIC VALVE LVOT Vmax:   112.00 cm/s LVOT Vmean:  72.500 cm/s LVOT VTI:    0.239 m  AORTA Ao Root diam: 3.60 cm Ao Asc diam:  3.80 cm MITRAL VALVE               TRICUSPID VALVE MV Area (PHT): 2.56 cm    TR Peak grad:   3.0 mmHg MV Decel Time: 296 msec    TR Vmax:        87.30 cm/s MR Peak grad: 2.7 mmHg MR Vmax:      82.30 cm/s   SHUNTS MV E velocity: 56.70 cm/s  Systemic VTI:  0.24 m MV A  velocity: 71.10 cm/s  Systemic Diam: 1.90 cm MV E/A ratio:  0.80 Donato Schultz MD Electronically signed by Donato Schultz MD Signature Date/Time: 06/20/2023/2:30:33 PM    Final    DG Chest Portable 1 View  Result Date: 06/19/2023 CLINICAL DATA:  cp, troponin elevated EXAM: PORTABLE CHEST 1 VIEW COMPARISON:  December 30, 2021 FINDINGS: The cardiomediastinal silhouette is unchanged in contour. No pleural effusion. No pneumothorax. No acute pleuroparenchymal abnormality. IMPRESSION: No acute cardiopulmonary abnormality. Electronically Signed   By: Meda Klinefelter M.D.   On: 06/19/2023 17:48   Disposition   Pt is being discharged home today in good condition.  Follow-up Plans & Appointments     Follow-up Information     [name], MD Follow up.   Specialty: Cardiothoracic Surgery Why: Dr. Sharee Pimple office will call you with all of your pre-surgery instructions. Contact information: [address]  [address] [place] [address] [phone]         Little Ishikawa, MD Follow up.   Specialties: Cardiology, Radiology Why: [facility] - we usually plan to see you back after your bypass surgery, which will be scheduled closer to time of discharge from the procedure. Please call our office if you have any concerns in the meantime. Contact information: [address] [place] [address] [phone]         Daisy Floro, MD Follow up.   Specialty: Family Medicine Why: Your TSH level was mildly elevated which can indicate underactive thyroid. However, this can also be elevated when the body is under the stress of other issues. Please follow up with your primary care provider to have this rechecked. Contact information: [address] [place] [address] [phone]                Discharge Instructions     AMB referral to Phase II Cardiac Rehabilitation   Complete by: As directed    Diagnosis: NSTEMI   After initial  evaluation and assessments completed: Virtual Based Care may be provided alone or in conjunction with Phase 2 Cardiac Rehab based on patient barriers.: Yes   Intensive Cardiac Rehabilitation (ICR) [facility] location only OR Traditional Cardiac Rehabilitation (TCR) *If criteria for ICR are not met will enroll in TCR [facility] only): Yes   Diet - low sodium heart healthy   Complete by: As directed    Discharge instructions   Complete by: As directed    Given that you'll be pending heart surgery soon, we would recommend to avoid taking vardenafil (Levitra) in case you need to take nitroglycerin since there is a drug interaction between the two medicines. We have discontinued this from your medicine list. You'll need to discuss with the surgeon after surgery when you can restart this medicine.   Increase activity slowly   Complete by: As directed    No driving for 1 day. No lifting over 10 lbs, sexual activity, or strenous activity until cleared by your heart surgeon. Keep cath procedure site clean & dry. If you notice increased pain, swelling, bleeding or pus, call/return!  You may shower, but no soaking baths/hot tubs/pools for 1 week.        Discharge Medications   Allergies as of 06/23/2023       Reactions   Milk-related Compounds Other (See Comments)   "I have a mild allergy to milk, but have some daily. It just makes my nose run a little."        Medication List     STOP taking these medications    vardenafil 20 MG tablet Commonly known as: LEVITRA       TAKE these medications    amLODipine 5 MG tablet Commonly known as: NORVASC Take 1 tablet (5 mg total) by mouth daily.   aspirin EC 81 MG tablet Take 1 tablet (81 mg total) by mouth daily. Swallow whole. Start taking on: June 24, 2023   atorvastatin 40 MG tablet Commonly known as: LIPITOR Take 1 tablet (40 mg total) by mouth daily. What changed: additional instructions   ezetimibe 10 MG tablet Commonly known as:  ZETIA Take 1 tablet (10 mg total) by mouth daily.   famotidine 20 MG tablet Commonly known as: PEPCID Take 20 mg by mouth daily as needed for heartburn or indigestion (or GERD-like symptoms).   finasteride 5 MG tablet Commonly known as: PROSCAR Take 5 mg  by mouth daily in the afternoon.   metoprolol succinate 50 MG 24 hr tablet Commonly known as: TOPROL-XL Take 25 mg by mouth in the morning.   nitroGLYCERIN 0.4 MG SL tablet Commonly known as: NITROSTAT Place 1 tablet (0.4 mg total) under the tongue every 5 (five) minutes as needed for chest pain (up to 3 doses).           Outstanding Labs/Studies   N/A  Duration of Discharge Encounter   Greater than 30 minutes including physician time.  Signed, Laurann Montana, PA-C 06/23/2023, 1:25 PM

## 2023-06-23 NOTE — TOC Transition Note (Signed)
Transition of Care [facility]) - CM/SW Discharge Note   Patient Details  Name: Dustin Mcclure MRN: [id number] Date of Birth: Aug 01, 1941  [facility] CM/SW Contact:  Leone Haven, RN Phone Number: 06/23/2023, 2:36 PM   Clinical Narrative:    Patient is for dc today, he has transportation. Has no needs.   Final next level of care: Home/Self Care Barriers to Discharge: No Barriers Identified   Patient Goals and CMS Choice   Choice offered to / list presented to : NA  Discharge Placement                         Discharge Plan and Services Additional resources added to the After Visit Summary for   In-house Referral: NA Discharge Planning Services: CM Consult Post Acute Care Choice: NA          DME Arranged: N/A DME Agency: NA                  Social Determinants of Health (SDOH) Interventions SDOH Screenings   Food Insecurity: No Food Insecurity (06/20/2023)  Housing: Low Risk  (06/20/2023)  Transportation Needs: No Transportation Needs (06/20/2023)  Utilities: Not At Risk (06/20/2023)  Tobacco Use: Medium Risk (06/19/2023)     Readmission Risk Interventions     No data to display

## 2023-06-23 NOTE — Progress Notes (Signed)
Pt has ambulated without difficulty this am. Discussed with pt and wife light walking at home, IS (1250 ml currently), sternal precautions, mobility post op, and d/c planning. Receptive with appropriate questions.  [id number] Ethelda Chick BS, ACSM-CEP 06/23/2023 11:58 AM

## 2023-06-23 NOTE — Plan of Care (Signed)

## 2023-06-23 NOTE — Plan of Care (Signed)
Problem: Education: Goal: Knowledge of General Education information will improve Description: Including pain rating scale, medication(s)/side effects and non-pharmacologic comfort measures 06/23/2023 1433 by Sherian Maroon, RN Outcome: Adequate for Discharge 06/23/2023 1335 by Sherian Maroon, RN Outcome: Adequate for Discharge   Problem: Health Behavior/Discharge Planning: Goal: Ability to manage health-related needs will improve 06/23/2023 1433 by Sherian Maroon, RN Outcome: Adequate for Discharge 06/23/2023 1335 by Sherian Maroon, RN Outcome: Adequate for Discharge   Problem: Clinical Measurements: Goal: Ability to maintain clinical measurements within normal limits will improve 06/23/2023 1433 by Sherian Maroon, RN Outcome: Adequate for Discharge 06/23/2023 1335 by Sherian Maroon, RN Outcome: Adequate for Discharge Goal: Will remain free from infection 06/23/2023 1433 by Sherian Maroon, RN Outcome: Adequate for Discharge 06/23/2023 1335 by Sherian Maroon, RN Outcome: Adequate for Discharge Goal: Diagnostic test results will improve 06/23/2023 1433 by Sherian Maroon, RN Outcome: Adequate for Discharge 06/23/2023 1335 by Sherian Maroon, RN Outcome: Adequate for Discharge Goal: Respiratory complications will improve 06/23/2023 1433 by Sherian Maroon, RN Outcome: Adequate for Discharge 06/23/2023 1335 by Sherian Maroon, RN Outcome: Adequate for Discharge Goal: Cardiovascular complication will be avoided 06/23/2023 1433 by Sherian Maroon, RN Outcome: Adequate for Discharge 06/23/2023 1335 by Sherian Maroon, RN Outcome: Adequate for Discharge   Problem: Activity: Goal: Risk for activity intolerance will decrease 06/23/2023 1433 by Sherian Maroon, RN Outcome: Adequate for Discharge 06/23/2023 1335 by Sherian Maroon, RN Outcome: Adequate for Discharge   Problem: Nutrition: Goal: Adequate nutrition will be maintained 06/23/2023 1433 by Sherian Maroon, RN Outcome:  Adequate for Discharge 06/23/2023 1335 by Sherian Maroon, RN Outcome: Adequate for Discharge   Problem: Coping: Goal: Level of anxiety will decrease 06/23/2023 1433 by Sherian Maroon, RN Outcome: Adequate for Discharge 06/23/2023 1335 by Sherian Maroon, RN Outcome: Adequate for Discharge   Problem: Elimination: Goal: Will not experience complications related to bowel motility 06/23/2023 1433 by Sherian Maroon, RN Outcome: Adequate for Discharge 06/23/2023 1335 by Sherian Maroon, RN Outcome: Adequate for Discharge Goal: Will not experience complications related to urinary retention 06/23/2023 1433 by Sherian Maroon, RN Outcome: Adequate for Discharge 06/23/2023 1335 by Sherian Maroon, RN Outcome: Adequate for Discharge   Problem: Pain Managment: Goal: General experience of comfort will improve 06/23/2023 1433 by Sherian Maroon, RN Outcome: Adequate for Discharge 06/23/2023 1335 by Sherian Maroon, RN Outcome: Adequate for Discharge   Problem: Safety: Goal: Ability to remain free from injury will improve 06/23/2023 1433 by Sherian Maroon, RN Outcome: Adequate for Discharge 06/23/2023 1335 by Sherian Maroon, RN Outcome: Adequate for Discharge   Problem: Skin Integrity: Goal: Risk for impaired skin integrity will decrease 06/23/2023 1433 by Sherian Maroon, RN Outcome: Adequate for Discharge 06/23/2023 1335 by Sherian Maroon, RN Outcome: Adequate for Discharge   Problem: Education: Goal: Understanding of CV disease, CV risk reduction, and recovery process will improve 06/23/2023 1433 by Sherian Maroon, RN Outcome: Adequate for Discharge 06/23/2023 1335 by Sherian Maroon, RN Outcome: Adequate for Discharge Goal: Individualized Educational Video(s) 06/23/2023 1433 by Sherian Maroon, RN Outcome: Adequate for Discharge 06/23/2023 1335 by Sherian Maroon, RN Outcome: Adequate for Discharge   Problem: Activity: Goal: Ability to return to baseline activity level will  improve 06/23/2023 1433 by Sherian Maroon, RN Outcome: Adequate for Discharge 06/23/2023 1335 by Sherian Maroon, RN Outcome: Adequate for Discharge  Problem: Cardiovascular: Goal: Ability to achieve and maintain adequate cardiovascular perfusion will improve 06/23/2023 1433 by Sherian Maroon, RN Outcome: Adequate for Discharge 06/23/2023 1335 by Sherian Maroon, RN Outcome: Adequate for Discharge Goal: Vascular access site(s) Level 0-1 will be maintained 06/23/2023 1433 by Sherian Maroon, RN Outcome: Adequate for Discharge 06/23/2023 1335 by Sherian Maroon, RN Outcome: Adequate for Discharge   Problem: Health Behavior/Discharge Planning: Goal: Ability to safely manage health-related needs after discharge will improve 06/23/2023 1433 by Sherian Maroon, RN Outcome: Adequate for Discharge 06/23/2023 1335 by Sherian Maroon, RN Outcome: Adequate for Discharge

## 2023-06-23 NOTE — Progress Notes (Signed)
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 It just makes my nose run a little."    Patient Measurements: Height: 5\' 8"  (172.7 cm) Weight: 74.8 kg (164 lb 14.5 oz) IBW/kg (Calculated) : 68.4 Heparin Dosing Weight: 75 kg  Vital Signs: Temp: 97.6 F (36.4 C) (07/31 0347) Temp Source: Oral (07/31 0347) BP: 135/70 (07/31 0347) Pulse Rate: 56 (07/31 0347)  Labs: Recent Labs    06/20/23 0535 06/20/23 1438 06/21/23 0213 06/22/23 0246 06/22/23 1653 06/23/23 0344  HGB 13.3  --  12.4* 12.2* 13.0  --   HCT 39.4  --  36.9* 35.1* 38.4*  --   PLT 180  --  157 152 172  --   HEPARINUNFRC 0.22*   < > 0.71* 0.76*  --  0.40  CREATININE 0.77  --   --  0.71  --  0.76  TROPONINIHS 102*  --   --   --   --   --    < > = values in this interval not displayed.    Estimated Creatinine Clearance: 71.3 mL/min (by C-G formula based on SCr of 0.76 mg/dL).   Medications: No anticoagulants PTA  Assessment: Pt is an 17 yoM with PMH significant for CAD. Pharmacy consulted to dose heparin for ACS. S/p LHC 7/30 noted to have significant stenosis - CT surgery consulted for revascularization.  TR band removed ~1740 per D/w RN. Pharmacy consulted to resume IV heparin 2 hours post TR band removal. Received ~5000 units during cath.   7/31 AM update:  Heparin level therapeutic   Goal of Therapy:  Heparin level 0.3-0.7 units/ml Monitor platelets by anticoagulation protocol: Yes   Plan:  Cont heparin 900 units/hr 1200 heparin level Planning for CABG-timing TBD  Abran Duke, PharmD, BCPS Clinical Pharmacist Phone: [phone]

## 2023-07-05 NOTE — Pre-Procedure Instructions (Addendum)
Surgical Instructions    Your procedure is scheduled on Thursday, August 15th.  Report to [facility] Main Entrance "A" at 5:30 A.M., then check in with the Admitting office.  Call this number if you have problems the morning of surgery:  [phone]  If you have any questions prior to your surgery date call [phone]: Open Monday-Friday 8am-4pm If you experience any cold or flu symptoms such as cough, fever, chills, shortness of breath, etc. between now and your scheduled surgery, please notify us at the above number.     Remember:  Do not eat or drink after midnight the night before your surgery    Take these medicines the morning of surgery with A SIP OF WATER  amLODipine (NORVASC)  atorvastatin (LIPITOR)  ezetimibe (ZETIA)  metoprolol succinate (TOPROL-XL)  omeprazole (PRILOSEC)   nitroGLYCERIN (NITROSTAT) - if needed for chest pain; please let a nurse know if you to use this.   As of today, STOP taking any Aleve, Naproxen, Ibuprofen, Motrin, Advil, Goody's, BC's, all herbal medications, fish oil, and all vitamins.                     Do NOT Smoke (Tobacco/Vaping) for 24 hours prior to your procedure.  If you use a CPAP at night, you may bring your mask/headgear for your overnight stay.   Contacts, glasses, piercing's, hearing aid's, dentures or partials may not be worn into surgery, please bring cases for these belongings.    For patients admitted to the hospital, discharge time will be determined by your treatment team.   Patients discharged the day of surgery will not be allowed to drive home, and someone needs to stay with them for 24 hours.  SURGICAL WAITING ROOM VISITATION Patients having surgery or a procedure may have no more than 2 support people in the waiting area - these visitors may rotate.   Children under the age of 79 must have an adult with them who is not the patient. If the patient needs to stay at the hospital during part of their recovery, the  visitor guidelines for inpatient rooms apply. Pre-op nurse will coordinate an appropriate time for 1 support person to accompany patient in pre-op.  This support person may not rotate.   Please refer to the [facility] website for the visitor guidelines for Inpatients (after your surgery is over and you are in a regular room).    Special instructions:   [place]- Preparing For Surgery  Before surgery, you can play an important role. Because skin is not sterile, your skin needs to be as free of germs as possible. You can reduce the number of germs on your skin by washing with CHG (chlorahexidine gluconate) Soap before surgery.  CHG is an antiseptic cleaner which kills germs and bonds with the skin to continue killing germs even after washing.    Oral Hygiene is also important to reduce your risk of infection.  Remember - BRUSH YOUR TEETH THE MORNING OF SURGERY WITH YOUR REGULAR TOOTHPASTE  Please do not use if you have an allergy to CHG or antibacterial soaps. If your skin becomes reddened/irritated stop using the CHG.  Do not shave (including legs and underarms) for at least 48 hours prior to first CHG shower. It is OK to shave your face.  Please follow these instructions carefully.   Shower the NIGHT BEFORE SURGERY and the MORNING OF SURGERY  If you chose to wash your hair, wash your hair first as usual  with your normal shampoo.  After you shampoo, rinse your hair and body thoroughly to remove the shampoo.  Use CHG Soap as you would any other liquid soap. You can apply CHG directly to the skin and wash gently with a scrungie or a clean washcloth.   Apply the CHG Soap to your body ONLY FROM THE NECK DOWN.  Do not use on open wounds or open sores. Avoid contact with your eyes, ears, mouth and genitals (private parts). Wash Face and genitals (private parts)  with your normal soap.   Wash thoroughly, paying special attention to the area where your surgery will be performed.  Thoroughly  rinse your body with warm water from the neck down.  DO NOT shower/wash with your normal soap after using and rinsing off the CHG Soap.  Pat yourself dry with a CLEAN TOWEL.  Wear CLEAN PAJAMAS to bed the night before surgery  Place CLEAN SHEETS on your bed the night before your surgery  DO NOT SLEEP WITH PETS.   Day of Surgery: Take a shower with CHG soap. Do not wear jewelry or makeup Do not wear lotions, powders, colognes, or deodorant. Do not shave 48 hours prior to surgery.  Men may shave face and neck. Do not bring valuables to the hospital.  [facility] is not responsible for any belongings or valuables. Wear Clean/Comfortable clothing the morning of surgery Remember to brush your teeth WITH YOUR REGULAR TOOTHPASTE.   Please read over the following fact sheets that you were given.    If you received a COVID test during your pre-op visit  it is requested that you wear a mask when out in public, stay away from anyone that may not be feeling well and notify your surgeon if you develop symptoms. If you have been in contact with anyone that has tested positive in the last 10 days please notify you surgeon.

## 2023-07-06 ENCOUNTER — Ambulatory Visit (HOSPITAL_COMMUNITY)
Admission: RE | Admit: 2023-07-06 | Discharge: 2023-07-06 | Disposition: A | Discharge status: Home / Self Care | Payer: Medicare Other | Source: Ambulatory Visit | Attending: Surgery | Admitting: Surgery

## 2023-07-06 ENCOUNTER — Encounter (HOSPITAL_COMMUNITY): Payer: Self-pay

## 2023-07-06 ENCOUNTER — Inpatient Hospital Stay (HOSPITAL_COMMUNITY): Admission: RE | Admit: 2023-07-06 | Payer: Medicare Other | Source: Ambulatory Visit

## 2023-07-06 ENCOUNTER — Ambulatory Visit (HOSPITAL_BASED_OUTPATIENT_CLINIC_OR_DEPARTMENT_OTHER)
Admission: RE | Admit: 2023-07-06 | Discharge: 2023-07-06 | Disposition: A | Discharge status: Home / Self Care | Payer: Medicare Other | Source: Ambulatory Visit | Attending: Surgery | Admitting: Surgery

## 2023-07-06 ENCOUNTER — Other Ambulatory Visit: Payer: Self-pay

## 2023-07-06 VITALS — BP 133/71 | HR 54 | Temp 98.5°F | Resp 18 | Ht 68.5 in | Wt 169.7 lb

## 2023-07-06 DIAGNOSIS — Z1152 Encounter for screening for COVID-19: Secondary | ICD-10-CM | POA: Insufficient documentation

## 2023-07-06 DIAGNOSIS — Z01818 Encounter for other preprocedural examination: Secondary | ICD-10-CM | POA: Insufficient documentation

## 2023-07-06 DIAGNOSIS — E7800 Pure hypercholesterolemia, unspecified: Secondary | ICD-10-CM | POA: Diagnosis not present

## 2023-07-06 DIAGNOSIS — I2510 Atherosclerotic heart disease of native coronary artery without angina pectoris: Secondary | ICD-10-CM

## 2023-07-06 DIAGNOSIS — I10 Essential (primary) hypertension: Secondary | ICD-10-CM | POA: Diagnosis not present

## 2023-07-06 DIAGNOSIS — I493 Ventricular premature depolarization: Secondary | ICD-10-CM | POA: Diagnosis not present

## 2023-07-06 DIAGNOSIS — Z79899 Other long term (current) drug therapy: Secondary | ICD-10-CM | POA: Diagnosis not present

## 2023-07-06 DIAGNOSIS — I472 Ventricular tachycardia, unspecified: Secondary | ICD-10-CM | POA: Diagnosis not present

## 2023-07-06 DIAGNOSIS — Z87891 Personal history of nicotine dependence: Secondary | ICD-10-CM | POA: Diagnosis not present

## 2023-07-06 DIAGNOSIS — E8770 Fluid overload, unspecified: Secondary | ICD-10-CM | POA: Diagnosis not present

## 2023-07-06 DIAGNOSIS — Z8249 Family history of ischemic heart disease and other diseases of the circulatory system: Secondary | ICD-10-CM | POA: Diagnosis not present

## 2023-07-06 DIAGNOSIS — D696 Thrombocytopenia, unspecified: Secondary | ICD-10-CM | POA: Diagnosis not present

## 2023-07-06 DIAGNOSIS — E875 Hyperkalemia: Secondary | ICD-10-CM | POA: Diagnosis not present

## 2023-07-06 DIAGNOSIS — Z9861 Coronary angioplasty status: Secondary | ICD-10-CM | POA: Diagnosis not present

## 2023-07-06 DIAGNOSIS — D62 Acute posthemorrhagic anemia: Secondary | ICD-10-CM | POA: Diagnosis not present

## 2023-07-06 HISTORY — DX: Benign prostatic hyperplasia without lower urinary tract symptoms: N40.0

## 2023-07-06 LAB — BLOOD GAS, ARTERIAL
Acid-Base Excess: 0.8 mmol/L (ref 0.0–2.0)
Bicarbonate: 25.3 mmol/L (ref 20.0–28.0)
Drawn by: 58793
O2 Saturation: 99.5 %
Patient temperature: 37
pCO2 arterial: 39 mmHg (ref 32–48)
pH, Arterial: 7.42 (ref 7.35–7.45)
pO2, Arterial: 120 mmHg — ABNORMAL HIGH (ref 83–108)

## 2023-07-06 LAB — SARS CORONAVIRUS 2 BY RT PCR: SARS Coronavirus 2 by RT PCR: NEGATIVE

## 2023-07-06 LAB — URINALYSIS, ROUTINE W REFLEX MICROSCOPIC
Bilirubin Urine: NEGATIVE
Glucose, UA: NEGATIVE mg/dL
Hgb urine dipstick: NEGATIVE
Ketones, ur: NEGATIVE mg/dL
Leukocytes,Ua: NEGATIVE
Nitrite: NEGATIVE
Protein, ur: NEGATIVE mg/dL
Specific Gravity, Urine: 1.005 (ref 1.005–1.030)
pH: 7 (ref 5.0–8.0)

## 2023-07-06 LAB — COMPREHENSIVE METABOLIC PANEL
ALT: 20 U/L (ref 0–44)
AST: 21 U/L (ref 15–41)
Albumin: 3.8 g/dL (ref 3.5–5.0)
Alkaline Phosphatase: 53 U/L (ref 38–126)
Anion gap: 12 (ref 5–15)
BUN: 14 mg/dL (ref 8–23)
CO2: 24 mmol/L (ref 22–32)
Calcium: 9.2 mg/dL (ref 8.9–10.3)
Chloride: 101 mmol/L (ref 98–111)
Creatinine, Ser: 0.65 mg/dL (ref 0.61–1.24)
GFR, Estimated: >60 mL/min (ref >60)
Glucose, Bld: 95 mg/dL (ref 70–99)
Potassium: 4.1 mmol/L (ref 3.5–5.1)
Sodium: 137 mmol/L (ref 135–145)
Total Bilirubin: 1.6 mg/dL — ABNORMAL HIGH (ref 0.3–1.2)
Total Protein: 6.4 g/dL — ABNORMAL LOW (ref 6.5–8.1)

## 2023-07-06 LAB — APTT: aPTT: 27 seconds (ref 24–36)

## 2023-07-06 LAB — PROTIME-INR
INR: 1 (ref 0.8–1.2)
Prothrombin Time: 13.8 seconds (ref 11.4–15.2)

## 2023-07-06 LAB — TYPE AND SCREEN
ABO/RH(D): A POS
Antibody Screen: NEGATIVE

## 2023-07-06 LAB — CBC
HCT: 39 % (ref 39.0–52.0)
Hemoglobin: 13.1 g/dL (ref 13.0–17.0)
MCH: 30.8 pg (ref 26.0–34.0)
MCHC: 33.6 g/dL (ref 30.0–36.0)
MCV: 91.5 fL (ref 80.0–100.0)
Platelets: 221 10*3/uL (ref 150–400)
RBC: 4.26 MIL/uL (ref 4.22–5.81)
RDW: 13.1 % (ref 11.5–15.5)
WBC: 7.3 10*3/uL (ref 4.0–10.5)
nRBC: 0 % (ref 0.0–0.2)

## 2023-07-06 LAB — SURGICAL PCR SCREEN
MRSA, PCR: NEGATIVE
Staphylococcus aureus: NEGATIVE

## 2023-07-06 NOTE — Progress Notes (Signed)
PCP - Dr. Gildardo Cranker Cardiologist - Dr. Leatrice Jewels  PPM/ICD - n/a  Chest x-ray - 07/06/23 EKG - 07/06/23 Stress Test - 02/11/22 ECHO - 06/20/23 Cardiac Cath - 06/22/23  Sleep Study - denies CPAP - denies  Blood Thinner Instructions: n/a Aspirin Instructions: Continue, none DOS.  NPO  COVID TEST- 07/06/23, done in PAT.  Anesthesia review: Yes, cardiac hx (CAD)  Patient denies shortness of breath, fever, cough and chest pain at PAT appointment   All instructions explained to the patient, with a verbal understanding of the material. Patient agrees to go over the instructions while at home for a better understanding. Patient also instructed to self quarantine after being tested for COVID-19. The opportunity to ask questions was provided.

## 2023-07-07 MED ORDER — DEXMEDETOMIDINE HCL IN NACL 400 MCG/100ML IV SOLN
0.1000 ug/kg/h | INTRAVENOUS | Status: AC
  Administered 2023-07-08: .7 ug/kg/h via INTRAVENOUS
  Filled 2023-07-07: qty 100

## 2023-07-07 MED ORDER — NITROGLYCERIN IN D5W 200-5 MCG/ML-% IV SOLN
2.0000 ug/min | INTRAVENOUS | Status: DC
  Filled 2023-07-07: qty 250

## 2023-07-07 MED ORDER — HEPARIN 30,000 UNITS/1000 ML (OHS) CELLSAVER SOLUTION
Status: DC
  Filled 2023-07-07: qty 1000

## 2023-07-07 MED ORDER — MAGNESIUM SULFATE 50 % IJ SOLN
40.0000 meq | INTRAMUSCULAR | Status: DC
  Filled 2023-07-07: qty 9.85

## 2023-07-07 MED ORDER — PHENYLEPHRINE HCL-NACL 20-0.9 MG/250ML-% IV SOLN
30.0000 ug/min | INTRAVENOUS | Status: DC
  Filled 2023-07-07: qty 250

## 2023-07-07 MED ORDER — TRANEXAMIC ACID (OHS) PUMP PRIME SOLUTION
2.0000 mg/kg | INTRAVENOUS | Status: DC
  Filled 2023-07-07: qty 1.54

## 2023-07-07 MED ORDER — CEFAZOLIN SODIUM-DEXTROSE 2-4 GM/100ML-% IV SOLN
2.0000 g | INTRAVENOUS | Status: DC
  Filled 2023-07-07: qty 100

## 2023-07-07 MED ORDER — TRANEXAMIC ACID (OHS) BOLUS VIA INFUSION
15.0000 mg/kg | INTRAVENOUS | Status: AC
  Administered 2023-07-08: 1155 mg via INTRAVENOUS
  Filled 2023-07-07: qty 1155

## 2023-07-07 MED ORDER — VANCOMYCIN HCL 1250 MG/250ML IV SOLN
1250.0000 mg | INTRAVENOUS | Status: AC
  Administered 2023-07-08: 1250 mg via INTRAVENOUS
  Filled 2023-07-07: qty 250

## 2023-07-07 MED ORDER — NOREPINEPHRINE 4 MG/250ML-% IV SOLN
0.0000 ug/min | INTRAVENOUS | Status: AC
  Administered 2023-07-08: 2 ug/min via INTRAVENOUS
  Filled 2023-07-07: qty 250

## 2023-07-07 MED ORDER — INSULIN REGULAR(HUMAN) IN NACL 100-0.9 UT/100ML-% IV SOLN
INTRAVENOUS | Status: AC
  Administered 2023-07-08: 1.3 [IU]/h via INTRAVENOUS
  Filled 2023-07-07: qty 100

## 2023-07-07 MED ORDER — CEFAZOLIN SODIUM-DEXTROSE 2-4 GM/100ML-% IV SOLN
2.0000 g | INTRAVENOUS | Status: AC
  Administered 2023-07-08 (×2): 2 g via INTRAVENOUS
  Filled 2023-07-07: qty 100

## 2023-07-07 MED ORDER — MILRINONE LACTATE IN DEXTROSE 20-5 MG/100ML-% IV SOLN
0.3000 ug/kg/min | INTRAVENOUS | Status: DC
  Filled 2023-07-07: qty 100

## 2023-07-07 MED ORDER — PLASMA-LYTE A IV SOLN
INTRAVENOUS | Status: DC
  Filled 2023-07-07: qty 2.5

## 2023-07-07 MED ORDER — TRANEXAMIC ACID 1000 MG/10ML IV SOLN
1.5000 mg/kg/h | INTRAVENOUS | Status: AC
  Administered 2023-07-08: 1.5 mg/kg/h via INTRAVENOUS
  Filled 2023-07-07: qty 25

## 2023-07-07 MED ORDER — EPINEPHRINE HCL 5 MG/250ML IV SOLN IN NS
0.0000 ug/min | INTRAVENOUS | Status: DC
  Filled 2023-07-07: qty 250

## 2023-07-07 MED ORDER — POTASSIUM CHLORIDE 2 MEQ/ML IV SOLN
80.0000 meq | INTRAVENOUS | Status: DC
  Filled 2023-07-07: qty 40

## 2023-07-07 NOTE — Anesthesia Preprocedure Evaluation (Addendum)
Anesthesia Evaluation  Patient identified by MRN, date of birth, ID band Patient awake    Reviewed: Allergy & Precautions, NPO status , Patient's Chart, lab work & pertinent test results  Airway Mallampati: II  TM Distance: >3 FB Neck ROM: Full    Dental no notable dental hx.    Pulmonary former smoker   Pulmonary exam normal        Cardiovascular hypertension, Pt. on medications and Pt. on home beta blockers + CAD   Rhythm:Regular Rate:Normal  ECHO 07/24:  1. Left ventricular ejection fraction, by estimation, is 60 to 65%. The  left ventricle has normal function. The left ventricle has no regional  wall motion abnormalities. Left ventricular diastolic parameters are  consistent with Grade I diastolic  dysfunction (impaired relaxation).   2. Right ventricular systolic function is normal. The right ventricular  size is mildly enlarged. There is normal pulmonary artery systolic  pressure.   3. Left atrial size was mildly dilated.   4. The mitral valve is normal in structure. No evidence of mitral valve  regurgitation. No evidence of mitral stenosis.   5. The aortic valve is normal in structure. Aortic valve regurgitation is  trivial. No aortic stenosis is present.   6. The inferior vena cava is normal in size with greater than 50%  respiratory variability, suggesting right atrial pressure of 3 mmHg.    Cath:  Mid LAD-1 lesion is 50% stenosed.   Mid LAD-2 lesion is 60% stenosed.   Prox RCA lesion is 99% stenosed.   Mid RCA lesion is 100% stenosed.   Prox Cx lesion is 80% stenosed.   1.  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 2.  LVEDP of 22 mmHg.      Neuro/Psych negative neurological ROS  negative psych ROS   GI/Hepatic Neg liver ROS,GERD   Medicated,,  Endo/Other  negative endocrine ROS    Renal/GU negative Renal ROS  negative genitourinary   Musculoskeletal negative musculoskeletal ROS (+)    Abdominal Normal abdominal exam  (+)   Peds  Hematology Lab Results      Component                Value               Date                      WBC                      7.3                 07/06/2023                HGB                      13.1                07/06/2023                HCT                      39.0                07/06/2023                MCV  91.5                07/06/2023                PLT                      221                 07/06/2023              Lab Results      Component                Value               Date                      NA                       137                 07/06/2023                K                        4.1                 07/06/2023                CO2                      24                  07/06/2023                GLUCOSE                  95                  07/06/2023                BUN                      14                  07/06/2023                CREATININE               0.65                07/06/2023                CALCIUM                  9.2                 07/06/2023                EGFR                     89                  06/15/2023                GFRNONAA                 >60  07/06/2023              Anesthesia Other Findings   Reproductive/Obstetrics                             Anesthesia Physical Anesthesia Plan  ASA: 4  Anesthesia Plan: General   Post-op Pain Management:    Induction: Intravenous  PONV Risk Score and Plan: 2 and Propofol infusion and Treatment may vary due to age or medical condition  Airway Management Planned: Mask and Oral ETT  Additional Equipment: Arterial line, CVP, PA Cath, TEE, 3D TEE and Ultrasound Guidance Line Placement  Intra-op Plan:    Post-operative Plan: Post-operative intubation/ventilation  Informed Consent: I have reviewed the patients History and Physical, chart, labs and discussed the procedure including the risks, benefits and alternatives for the proposed anesthesia with the patient or authorized representative who has indicated his/her understanding and acceptance.     Dental advisory given  Plan Discussed with: CRNA  Anesthesia Plan Comments:        Anesthesia Quick Evaluation

## 2023-07-07 NOTE — H&P (Signed)
[address]       [place] [address]             [phone]      Cardiothoracic Surgery Admission History and Physical   [facility] Medical Record #[id number] Date of Birth: 03-01-1941   Referring: No ref. provider found Primary Care: Daisy Floro, MD Primary Cardiologist:Christopher Karlyne Greenspan, MD   Chief Complaint:       Chief Complaint  Patient presents with multivessel coronary artery disease         History of Present Illness:     Mr. Burgy is an 82 year old male with a past medical history of CAD (s/p angioplasty to LAD in 1990), HTN, and HLD. He presented to the ED on 07/27 with complaints of palpitations, weakness and lightheadedness that had happened 3 days prior and lasted about 1 hour with another episode on the 27th that lasted about 3 hours which prompted him to seek care at [facility] ED. He denies chest pain, shortness of breath and dizziness. He does admit to a couple possible episodes a few weeks prior. His Troponin I (high sensitivity) while in the ED peaked at 183. EKG showed a normal sinus rhythm with no sign of acute ischemia. He was admitted to the hospital for further workup. He has a history of NSVT and junctional rhythm that was found on a 14 day heart monitor in March 2024. His nuclear scan and echo in 2023 were normal/stable. He was transferred to [facility] for further workup. CT morphology scan showed moderate to severe mixed left man and multivessel CAD and his calcium score was 4273 placing him in the 94th percentile. He then underwent heart catheterization on 07/30 which showed severe multivessel disease including 50-60% stenosis of the LAD, 80% stenosis of the proximal circumflex and chronic total occlusion of the right coronary artery collateralized by the left circumflex. Echocardiogram on 07/28 had an estimated LVEF of 60-65%, grade 1 diastolic dysfunction, and no valvular abnormalities.    He  is currently chest pain free. He is retired and lives in a 2 story home with his wife. He is active and still uses his treadmill at home often.    Current Activity/ Functional Status: Patient is independent with mobility/ambulation, transfers, ADL's, IADL's.   Zubrod Score: At the time of surgery this patient's most appropriate activity status/level should be described as: []     0    Normal activity, no symptoms [x]     1    Restricted in physical strenuous activity but ambulatory, able to do out light work []     2    Ambulatory and capable of self care, unable to do work activities, up and about                 more than 50%  Of the time                            []     3    Only limited self care, in bed greater than 50% of waking hours []     4    Completely disabled, no self care, confined to bed or chair []     5    Moribund       Past Medical History:  Diagnosis Date   CAD (coronary artery disease)     HTN (hypertension)  Hypercholesteremia                 Past Surgical History:  Procedure Laterality Date   CORONARY ANGIOPLASTY   1990    mid-LAD          Tobacco Use History  Social History        Tobacco Use  Smoking Status Former   Current packs/day: 0.00   Types: Cigarettes   Quit date: 1973   Years since quitting: 51.6  Smokeless Tobacco Never      Social History        Substance and Sexual Activity  Alcohol Use Yes    Comment: occationally        Allergies       Allergies  Allergen Reactions   Milk-Related Compounds Other (See Comments)      "I have a mild allergy to milk, but have some daily. It just makes my nose run a little."                 Current Facility-Administered Medications  Medication Dose Route Frequency Provider Last Rate Last Admin   0.9% sodium chloride infusion  1 mL/kg/hr Intravenous Continuous Leone Brand, NP 74.9 mL/hr at 06/22/23 1519 1 mL/kg/hr at 06/22/23 1519   [MAR Hold] acetaminophen (TYLENOL) tablet 650 mg   650 mg Oral Q6H PRN Shalhoub, Deno Lunger, MD        Or   Mitzi Hansen Hold] acetaminophen (TYLENOL) suppository 650 mg  650 mg Rectal Q6H PRN Shalhoub, Deno Lunger, MD       [MAR Hold] amLODipine (NORVASC) tablet 5 mg  5 mg Oral Daily Shalhoub, Deno Lunger, MD   5 mg at 06/22/23 0800   [MAR Hold] aspirin EC tablet 81 mg  81 mg Oral Daily Dunn, Dayna N, PA-C       [MAR Hold] atorvastatin (LIPITOR) tablet 40 mg  40 mg Oral QHS Marinda Elk, MD   40 mg at 06/21/23 2126   [MAR Hold] ezetimibe (ZETIA) tablet 10 mg  10 mg Oral Daily Marinda Elk, MD   10 mg at 06/22/23 0800   [MAR Hold] finasteride (PROSCAR) tablet 5 mg  5 mg Oral Q1500 Marinda Elk, MD   5 mg at 06/21/23 1508   heparin ADULT infusion 100 units/mL (25000 units/231mL)  900 Units/hr Intravenous Continuous Silvana Newness, [facility]   Stopped at 06/22/23 1358   [MAR Hold] hydrALAZINE (APRESOLINE) injection 10 mg  10 mg Intravenous Q6H PRN Shalhoub, Deno Lunger, MD       [MAR Hold] metoprolol succinate (TOPROL-XL) 24 hr tablet 25 mg  25 mg Oral Daily Shalhoub, Deno Lunger, MD   25 mg at 06/22/23 0800   [MAR Hold] nitroGLYCERIN (NITROSTAT) SL tablet 0.4 mg  0.4 mg Sublingual Q5 min PRN Shalhoub, Deno Lunger, MD       [MAR Hold] ondansetron [facility]) tablet 4 mg  4 mg Oral Q6H PRN Shalhoub, Deno Lunger, MD        Or   Mitzi Hansen Hold] ondansetron [facility]) injection 4 mg  4 mg Intravenous Q6H PRN Shalhoub, Deno Lunger, MD       [MAR Hold] pantoprazole (PROTONIX) EC tablet 40 mg  40 mg Oral Daily Shalhoub, Deno Lunger, MD   40 mg at 06/22/23 0800   [MAR Hold] polyethylene glycol (MIRALAX / GLYCOLAX) packet 17 g  17 g Oral Daily PRN Shalhoub, Deno Lunger, MD  Medications Prior to Admission  Medication Sig Dispense Refill Last Dose   amLODipine (NORVASC) 5 MG tablet Take 1 tablet (5 mg total) by mouth daily. 30 tablet 5 06/19/2023 at am   atorvastatin (LIPITOR) 40 MG tablet Take 1 tablet (40 mg total) by mouth daily. Please schedule appointment for  further refills (Patient taking differently: Take 40 mg by mouth at bedtime.) 30 tablet 1 06/18/2023 at pm   ezetimibe (ZETIA) 10 MG tablet Take 1 tablet (10 mg total) by mouth daily. 90 tablet 3 06/19/2023 at am   famotidine (PEPCID) 20 MG tablet Take 20 mg by mouth daily as needed for heartburn or indigestion (or GERD-like symptoms).     unk   finasteride (PROSCAR) 5 MG tablet Take 5 mg by mouth daily in the afternoon.     06/18/2023   metoprolol succinate (TOPROL-XL) 50 MG 24 hr tablet Take 25 mg by mouth in the morning.     06/19/2023 at 0830   vardenafil (LEVITRA) 20 MG tablet Take 20 mg by mouth daily as needed for erectile dysfunction.     Past Month               Family History  Problem Relation Age of Onset   Heart attack Brother              Review of Systems:  Review of Systems  Constitutional:  Positive for malaise/fatigue and weight loss. Negative for chills, diaphoresis and fever.       Expected 25-30lb weight loss over a couple years  HENT:  Negative for hearing loss and tinnitus.   Eyes:        Cataracts  Respiratory:  Negative for cough, shortness of breath and wheezing.   Cardiovascular:  Positive for palpitations. Negative for chest pain, orthopnea, claudication and leg swelling.  Gastrointestinal:  Positive for heartburn. Negative for nausea and vomiting.  Genitourinary:  Negative for dysuria.       Some trouble with urination and frequent urination due to BPH  Musculoskeletal:  Negative for myalgias.       No hx of varicose veins  Neurological:  Positive for weakness. Negative for dizziness, loss of consciousness and headaches.       Lightheadedness  Endo/Heme/Allergies:  Does not bruise/bleed easily.       Allergic rhinitis  Psychiatric/Behavioral:  Negative for depression. The patient is not nervous/anxious.   Last dental visit a couple years ago, dentures   Physical Exam: BP 122/73   Pulse 69   Temp 98.3 F (36.8 C) (Oral)   Resp 19   Ht 5\' 8"  (1.727  m)   Wt 74.6 kg   SpO2 100%   BMI 25.01 kg/m    General appearance: alert, cooperative, and no distress Head: Normocephalic, without obvious abnormality, atraumatic Neck: no adenopathy, no carotid bruit, no JVD, supple, symmetrical, trachea midline, and thyroid not enlarged, symmetric, no tenderness/mass/nodules Lymph nodes: Cervical, supraclavicular, and axillary nodes normal. Resp: clear to auscultation bilaterally Cardio: regular rate and rhythm, S1, S2 normal, no murmur, click, rub or gallop GI: soft, non-tender; bowel sounds normal; no masses,  no organomegaly Extremities: extremities normal, atraumatic, no cyanosis or edema. DP/PT pulses 2+ Neurologic: Grossly normal   Diagnostic Studies & Radiology Findings:  Dominance: Right Left Anterior Descending  Mid LAD-1 lesion is 50% stenosed.  Mid LAD-2 lesion is 60% stenosed.    Left Circumflex  Prox Cx lesion is 80% stenosed.    Right Coronary Artery  Prox RCA  lesion is 99% stenosed.  Mid RCA lesion is 100% stenosed.    Third Right Posterolateral Branch  Collaterals  3rd RPL filled by collaterals from 3rd Mrg.     Collaterals  3rd RPL filled by collaterals from 3rd Mrg.          ECHOCARDIOGRAM REPORT   Patient Name:   JCEON HARGENS Date of Exam: 06/20/2023  Medical Rec #:  [id number]      Height:       68.0 in  Accession #:    [id number]     Weight:       166.2 lb  Date of Birth:  27-Feb-1941       BSA:          1.889 m  Patient Age:    81 years       BP:           141/81 mmHg  Patient Gender: M              HR:           68 bpm.  Exam Location:  Inpatient   Procedure: 2D Echo, Cardiac Doppler and Color Doppler   Indications:    Elevated Troponin    History:        Patient has prior history of Echocardiogram examinations,  most                 recent 02/11/2022. CAD; Risk Factors:Hypertension,  Dyslipidemia                 and Former Smoker.    Sonographer:    Aron Baba  Referring Phys: [id number]    Sonographer Comments: Suboptimal subcostal window. Image acquisition  challenging due to respiratory motion.  IMPRESSIONS   1. Left ventricular ejection fraction, by estimation, is 60 to 65%. The  left ventricle has normal function. The left ventricle has no regional  wall motion abnormalities. Left ventricular diastolic parameters are  consistent with Grade I diastolic  dysfunction (impaired relaxation).   2. Right ventricular systolic function is normal. The right ventricular  size is mildly enlarged. There is normal pulmonary artery systolic  pressure.   3. Left atrial size was mildly dilated.   4. The mitral valve is normal in structure. No evidence of mitral valve  regurgitation. No evidence of mitral stenosis.   5. The aortic valve is normal in structure. Aortic valve regurgitation is  trivial. No aortic stenosis is present.   6. The inferior vena cava is normal in size with greater than 50%  respiratory variability, suggesting right atrial pressure of 3 mmHg.   Comparison(s): Prior images reviewed side by side.   FINDINGS   Left Ventricle: Left ventricular ejection fraction, by estimation, is 60  to 65%. The left ventricle has normal function. The left ventricle has no  regional wall motion abnormalities. The left ventricular internal cavity  size was normal in size. There is   no left ventricular hypertrophy. Left ventricular diastolic parameters  are consistent with Grade I diastolic dysfunction (impaired relaxation).   Right Ventricle: The right ventricular size is mildly enlarged. No  increase in right ventricular wall thickness. Right ventricular systolic  function is normal. There is normal pulmonary artery systolic pressure.  The tricuspid regurgitant velocity is 0.87   m/s, and with an assumed right atrial pressure of 3 mmHg, the estimated  right ventricular systolic pressure is 6.0 mmHg.   Left Atrium: Left atrial size was  mildly dilated.   Right Atrium:  Right atrial size was normal in size.   Pericardium: There is no evidence of pericardial effusion.   Mitral Valve: The mitral valve is normal in structure. No evidence of  mitral valve regurgitation. No evidence of mitral valve stenosis.   Tricuspid Valve: The tricuspid valve is normal in structure. Tricuspid  valve regurgitation is not demonstrated. No evidence of tricuspid  stenosis.   Aortic Valve: The aortic valve is normal in structure. Aortic valve  regurgitation is trivial. No aortic stenosis is present.   Pulmonic Valve: The pulmonic valve was normal in structure. Pulmonic valve  regurgitation is not visualized. No evidence of pulmonic stenosis.   Aorta: The aortic root is normal in size and structure.   Venous: The inferior vena cava is normal in size with greater than 50%  respiratory variability, suggesting right atrial pressure of 3 mmHg.   IAS/Shunts: No atrial level shunt detected by color flow Doppler.     LEFT VENTRICLE  PLAX 2D  LVIDd:         4.60 cm   Diastology  LVIDs:         2.70 cm   LV e' medial:    4.68 cm/s  LV PW:         1.10 cm   LV E/e' medial:  12.1  LV IVS:        0.80 cm   LV e' lateral:   5.00 cm/s  LVOT diam:     1.90 cm   LV E/e' lateral: 11.3  LV SV:         68  LV SV Index:   36  LVOT Area:     2.84 cm     RIGHT VENTRICLE  RV S prime:     13.50 cm/s  TAPSE (M-mode): 1.3 cm   LEFT ATRIUM           Index        RIGHT ATRIUM           Index  LA diam:      3.60 cm 1.91 cm/m   RA Area:     12.20 cm  LA Vol (A2C): 67.9 ml 35.94 ml/m  RA Volume:   28.50 ml  15.09 ml/m  LA Vol (A4C): 22.7 ml 12.02 ml/m   AORTIC VALVE  LVOT Vmax:   112.00 cm/s  LVOT Vmean:  72.500 cm/s  LVOT VTI:    0.239 m    AORTA  Ao Root diam: 3.60 cm  Ao Asc diam:  3.80 cm   MITRAL VALVE               TRICUSPID VALVE  MV Area (PHT): 2.56 cm    TR Peak grad:   3.0 mmHg  MV Decel Time: 296 msec    TR Vmax:        87.30 cm/s  MR Peak grad: 2.7 mmHg  MR  Vmax:      82.30 cm/s   SHUNTS  MV E velocity: 56.70 cm/s  Systemic VTI:  0.24 m  MV A velocity: 71.10 cm/s  Systemic Diam: 1.90 cm  MV E/A ratio:  0.80   Donato Schultz MD  Electronically signed by Donato Schultz MD  Signature Date/Time: 06/20/2023/2:30:33 PM      Final      Assessment and Plan: CAD/Elevated troponin (possible NSTEMI): Pt is an active 82 year old who would benefit from CABG surgery. Pt is willing to undergo  surgery and seems to be a reasonable candidate. Dr. Laneta Simmers to determine ultimate candidacy and timing but he has OR availability this Thursday.  Palpitations: Hx of junction rhythm and NSVT HTN: Losartan d/c'd due to hyperkalemia. BP controlled on Amlodipine 5mg  daily and Toprol XL 25mg  daily HLD: Atorvastatin and Ezetimibe   Jenny Reichmann, PA-C    Chart reviewed, patient examined, agree with above.  This 82 year old fairly active gentleman presented with an episode of tachypalpitations and weakness and had a positive troponin of 183. ECG unremarkable. Calcium scoring CT 4273 94% percentile with moderate to severe mixed LM and multivessel CAD. CT FFR showed significant stenosis in the proximal to mid LAD and proximal LCX, RCA normal. Cath showed 50% -60% mid LAD stenoses. LCX has 80% proximal stenosis. RCA with mid occlusion, collateralized from the LCX. LVEDP 22. He denies having any CP, SOB or fatigue with low level exercise walking on his treadmill. I agree that CABG is indicated for his significant stenoses to prevent further ischemia and infarction. I discussed the operative procedure with the patient and family including alternatives, benefits and risks; including but not limited to bleeding, blood transfusion, infection, stroke, myocardial infarction, graft failure, heart block requiring a permanent pacemaker, organ dysfunction, and death.  Calin Makris understands and agrees to proceed.    [name], MD

## 2023-07-08 ENCOUNTER — Inpatient Hospital Stay (HOSPITAL_COMMUNITY): Payer: Medicare Other

## 2023-07-08 ENCOUNTER — Inpatient Hospital Stay (HOSPITAL_COMMUNITY): Payer: Medicare Other | Admitting: Anesthesiology

## 2023-07-08 ENCOUNTER — Other Ambulatory Visit: Payer: Self-pay

## 2023-07-08 ENCOUNTER — Encounter (HOSPITAL_COMMUNITY): Payer: Self-pay | Admitting: Surgery

## 2023-07-08 ENCOUNTER — Inpatient Hospital Stay (HOSPITAL_COMMUNITY)
Admission: RE | Admit: 2023-07-08 | Discharge: 2023-07-12 | DRG: 236 | Disposition: A | Discharge status: Home / Self Care | Payer: Medicare Other | Attending: Surgery | Admitting: Surgery

## 2023-07-08 ENCOUNTER — Inpatient Hospital Stay (HOSPITAL_COMMUNITY): Payer: Medicare Other | Admitting: Physician Assistant

## 2023-07-08 ENCOUNTER — Inpatient Hospital Stay (HOSPITAL_BASED_OUTPATIENT_CLINIC_OR_DEPARTMENT_OTHER): Payer: Medicare Other

## 2023-07-08 ENCOUNTER — Inpatient Hospital Stay (HOSPITAL_COMMUNITY)
Admission: RE | Disposition: A | Discharge status: Home / Self Care | Payer: Self-pay | Source: Home / Self Care | Attending: Surgery

## 2023-07-08 DIAGNOSIS — D62 Acute posthemorrhagic anemia: Secondary | ICD-10-CM | POA: Diagnosis not present

## 2023-07-08 DIAGNOSIS — E8770 Fluid overload, unspecified: Secondary | ICD-10-CM | POA: Diagnosis not present

## 2023-07-08 DIAGNOSIS — Z79899 Other long term (current) drug therapy: Secondary | ICD-10-CM

## 2023-07-08 DIAGNOSIS — E785 Hyperlipidemia, unspecified: Secondary | ICD-10-CM

## 2023-07-08 DIAGNOSIS — I2510 Atherosclerotic heart disease of native coronary artery without angina pectoris: Secondary | ICD-10-CM

## 2023-07-08 DIAGNOSIS — J90 Pleural effusion, not elsewhere classified: Secondary | ICD-10-CM | POA: Diagnosis not present

## 2023-07-08 DIAGNOSIS — I10 Essential (primary) hypertension: Secondary | ICD-10-CM

## 2023-07-08 DIAGNOSIS — I472 Ventricular tachycardia, unspecified: Secondary | ICD-10-CM | POA: Diagnosis not present

## 2023-07-08 DIAGNOSIS — Z9861 Coronary angioplasty status: Secondary | ICD-10-CM

## 2023-07-08 DIAGNOSIS — Z452 Encounter for adjustment and management of vascular access device: Secondary | ICD-10-CM | POA: Diagnosis not present

## 2023-07-08 DIAGNOSIS — Z8249 Family history of ischemic heart disease and other diseases of the circulatory system: Secondary | ICD-10-CM | POA: Diagnosis not present

## 2023-07-08 DIAGNOSIS — I493 Ventricular premature depolarization: Secondary | ICD-10-CM | POA: Diagnosis present

## 2023-07-08 DIAGNOSIS — E7800 Pure hypercholesterolemia, unspecified: Secondary | ICD-10-CM | POA: Diagnosis present

## 2023-07-08 DIAGNOSIS — Z4682 Encounter for fitting and adjustment of non-vascular catheter: Secondary | ICD-10-CM | POA: Diagnosis not present

## 2023-07-08 DIAGNOSIS — J939 Pneumothorax, unspecified: Secondary | ICD-10-CM | POA: Diagnosis not present

## 2023-07-08 DIAGNOSIS — Z951 Presence of aortocoronary bypass graft: Secondary | ICD-10-CM

## 2023-07-08 DIAGNOSIS — E782 Mixed hyperlipidemia: Secondary | ICD-10-CM | POA: Diagnosis not present

## 2023-07-08 DIAGNOSIS — Z1152 Encounter for screening for COVID-19: Secondary | ICD-10-CM | POA: Diagnosis not present

## 2023-07-08 DIAGNOSIS — E875 Hyperkalemia: Secondary | ICD-10-CM | POA: Diagnosis not present

## 2023-07-08 DIAGNOSIS — D696 Thrombocytopenia, unspecified: Secondary | ICD-10-CM | POA: Diagnosis present

## 2023-07-08 DIAGNOSIS — Z87891 Personal history of nicotine dependence: Secondary | ICD-10-CM | POA: Diagnosis not present

## 2023-07-08 DIAGNOSIS — J9811 Atelectasis: Secondary | ICD-10-CM | POA: Diagnosis not present

## 2023-07-08 HISTORY — PX: CORONARY ARTERY BYPASS GRAFT: SHX141

## 2023-07-08 HISTORY — PX: TEE WITHOUT CARDIOVERSION: SHX5443

## 2023-07-08 LAB — POCT I-STAT, CHEM 8
BUN: 13 mg/dL (ref 8–23)
BUN: 13 mg/dL (ref 8–23)
BUN: 12 mg/dL (ref 8–23)
BUN: 12 mg/dL (ref 8–23)
Calcium, Ion: 1.21 mmol/L (ref 1.15–1.40)
Calcium, Ion: 1.19 mmol/L (ref 1.15–1.40)
Calcium, Ion: 1.05 mmol/L — ABNORMAL LOW (ref 1.15–1.40)
Calcium, Ion: 1.06 mmol/L — ABNORMAL LOW (ref 1.15–1.40)
Chloride: 101 mmol/L (ref 98–111)
Chloride: 100 mmol/L (ref 98–111)
Chloride: 100 mmol/L (ref 98–111)
Chloride: 101 mmol/L (ref 98–111)
Creatinine, Ser: 0.6 mg/dL — ABNORMAL LOW (ref 0.61–1.24)
Creatinine, Ser: 0.6 mg/dL — ABNORMAL LOW (ref 0.61–1.24)
Creatinine, Ser: 0.5 mg/dL — ABNORMAL LOW (ref 0.61–1.24)
Creatinine, Ser: 0.6 mg/dL — ABNORMAL LOW (ref 0.61–1.24)
Glucose, Bld: 102 mg/dL — ABNORMAL HIGH (ref 70–99)
Glucose, Bld: 107 mg/dL — ABNORMAL HIGH (ref 70–99)
Glucose, Bld: 131 mg/dL — ABNORMAL HIGH (ref 70–99)
Glucose, Bld: 135 mg/dL — ABNORMAL HIGH (ref 70–99)
HCT: 33 % — ABNORMAL LOW (ref 39.0–52.0)
HCT: 31 % — ABNORMAL LOW (ref 39.0–52.0)
HCT: 25 % — ABNORMAL LOW (ref 39.0–52.0)
HCT: 25 % — ABNORMAL LOW (ref 39.0–52.0)
Hemoglobin: 11.2 g/dL — ABNORMAL LOW (ref 13.0–17.0)
Hemoglobin: 10.5 g/dL — ABNORMAL LOW (ref 13.0–17.0)
Hemoglobin: 8.5 g/dL — ABNORMAL LOW (ref 13.0–17.0)
Hemoglobin: 8.5 g/dL — ABNORMAL LOW (ref 13.0–17.0)
Potassium: 3.7 mmol/L (ref 3.5–5.1)
Potassium: 3.7 mmol/L (ref 3.5–5.1)
Potassium: 4.1 mmol/L (ref 3.5–5.1)
Potassium: 4.6 mmol/L (ref 3.5–5.1)
Sodium: 136 mmol/L (ref 135–145)
Sodium: 136 mmol/L (ref 135–145)
Sodium: 134 mmol/L — ABNORMAL LOW (ref 135–145)
Sodium: 135 mmol/L (ref 135–145)
TCO2: 28 mmol/L (ref 22–32)
TCO2: 26 mmol/L (ref 22–32)
TCO2: 24 mmol/L (ref 22–32)
TCO2: 25 mmol/L (ref 22–32)

## 2023-07-08 LAB — POCT I-STAT 7, (LYTES, BLD GAS, ICA,H+H)
Acid-Base Excess: 0 mmol/L (ref 0.0–2.0)
Acid-Base Excess: 0 mmol/L (ref 0.0–2.0)
Acid-base deficit: 3 mmol/L — ABNORMAL HIGH (ref 0.0–2.0)
Acid-base deficit: 1 mmol/L (ref 0.0–2.0)
Bicarbonate: 24.6 mmol/L (ref 20.0–28.0)
Bicarbonate: 22.5 mmol/L (ref 20.0–28.0)
Bicarbonate: 20.7 mmol/L (ref 20.0–28.0)
Bicarbonate: 24.7 mmol/L (ref 20.0–28.0)
Calcium, Ion: 1.05 mmol/L — ABNORMAL LOW (ref 1.15–1.40)
Calcium, Ion: 1.11 mmol/L — ABNORMAL LOW (ref 1.15–1.40)
Calcium, Ion: 1.02 mmol/L — ABNORMAL LOW (ref 1.15–1.40)
Calcium, Ion: 1.09 mmol/L — ABNORMAL LOW (ref 1.15–1.40)
HCT: 25 % — ABNORMAL LOW (ref 39.0–52.0)
HCT: 33 % — ABNORMAL LOW (ref 39.0–52.0)
HCT: 26 % — ABNORMAL LOW (ref 39.0–52.0)
HCT: 27 % — ABNORMAL LOW (ref 39.0–52.0)
Hemoglobin: 8.5 g/dL — ABNORMAL LOW (ref 13.0–17.0)
Hemoglobin: 11.2 g/dL — ABNORMAL LOW (ref 13.0–17.0)
Hemoglobin: 8.8 g/dL — ABNORMAL LOW (ref 13.0–17.0)
Hemoglobin: 9.2 g/dL — ABNORMAL LOW (ref 13.0–17.0)
O2 Saturation: 100 %
O2 Saturation: 100 %
O2 Saturation: 100 %
O2 Saturation: 99 %
Patient temperature: 35.4
Patient temperature: 36.3
Patient temperature: 36.5
Potassium: 4.9 mmol/L (ref 3.5–5.1)
Potassium: 3.9 mmol/L (ref 3.5–5.1)
Potassium: 4 mmol/L (ref 3.5–5.1)
Potassium: 4 mmol/L (ref 3.5–5.1)
Sodium: 136 mmol/L (ref 135–145)
Sodium: 137 mmol/L (ref 135–145)
Sodium: 138 mmol/L (ref 135–145)
Sodium: 139 mmol/L (ref 135–145)
TCO2: 26 mmol/L (ref 22–32)
TCO2: 24 mmol/L (ref 22–32)
TCO2: 21 mmol/L — ABNORMAL LOW (ref 22–32)
TCO2: 26 mmol/L (ref 22–32)
pCO2 arterial: 39.5 mmHg (ref 32–48)
pCO2 arterial: 38 mmHg (ref 32–48)
pCO2 arterial: 20 mmHg — ABNORMAL LOW (ref 32–48)
pCO2 arterial: 45.3 mmHg (ref 32–48)
pH, Arterial: 7.403 (ref 7.35–7.45)
pH, Arterial: 7.372 (ref 7.35–7.45)
pH, Arterial: 7.62 (ref 7.35–7.45)
pH, Arterial: 7.343 — ABNORMAL LOW (ref 7.35–7.45)
pO2, Arterial: 361 mmHg — ABNORMAL HIGH (ref 83–108)
pO2, Arterial: 194 mmHg — ABNORMAL HIGH (ref 83–108)
pO2, Arterial: 174 mmHg — ABNORMAL HIGH (ref 83–108)
pO2, Arterial: 129 mmHg — ABNORMAL HIGH (ref 83–108)

## 2023-07-08 LAB — CBC WITH DIFFERENTIAL/PLATELET
Abs Immature Granulocytes: 0.05 10*3/uL (ref 0.00–0.07)
Basophils Absolute: 0 10*3/uL (ref 0.0–0.1)
Basophils Relative: 0 %
Eosinophils Absolute: 0 10*3/uL (ref 0.0–0.5)
Eosinophils Relative: 0 %
HCT: 27.4 % — ABNORMAL LOW (ref 39.0–52.0)
Hemoglobin: 9.3 g/dL — ABNORMAL LOW (ref 13.0–17.0)
Immature Granulocytes: 0 %
Lymphocytes Relative: 5 %
Lymphs Abs: 0.5 10*3/uL — ABNORMAL LOW (ref 0.7–4.0)
MCH: 30.5 pg (ref 26.0–34.0)
MCHC: 33.9 g/dL (ref 30.0–36.0)
MCV: 89.8 fL (ref 80.0–100.0)
Monocytes Absolute: 0.6 10*3/uL (ref 0.1–1.0)
Monocytes Relative: 5 %
Neutro Abs: 10.5 10*3/uL — ABNORMAL HIGH (ref 1.7–7.7)
Neutrophils Relative %: 90 %
Platelets: 116 10*3/uL — ABNORMAL LOW (ref 150–400)
RBC: 3.05 MIL/uL — ABNORMAL LOW (ref 4.22–5.81)
RDW: 13 % (ref 11.5–15.5)
WBC: 11.8 10*3/uL — ABNORMAL HIGH (ref 4.0–10.5)
nRBC: 0 % (ref 0.0–0.2)

## 2023-07-08 LAB — BASIC METABOLIC PANEL
Anion gap: 8 (ref 5–15)
BUN: 11 mg/dL (ref 8–23)
CO2: 22 mmol/L (ref 22–32)
Calcium: 7.6 mg/dL — ABNORMAL LOW (ref 8.9–10.3)
Chloride: 106 mmol/L (ref 98–111)
Creatinine, Ser: 0.68 mg/dL (ref 0.61–1.24)
GFR, Estimated: >60 mL/min (ref >60)
Glucose, Bld: 118 mg/dL — ABNORMAL HIGH (ref 70–99)
Potassium: 3.8 mmol/L (ref 3.5–5.1)
Sodium: 136 mmol/L (ref 135–145)

## 2023-07-08 LAB — MAGNESIUM: Magnesium: 2.9 mg/dL — ABNORMAL HIGH (ref 1.7–2.4)

## 2023-07-08 LAB — GLUCOSE, CAPILLARY
Glucose-Capillary: 110 mg/dL — ABNORMAL HIGH (ref 70–99)
Glucose-Capillary: 112 mg/dL — ABNORMAL HIGH (ref 70–99)
Glucose-Capillary: 120 mg/dL — ABNORMAL HIGH (ref 70–99)
Glucose-Capillary: 121 mg/dL — ABNORMAL HIGH (ref 70–99)
Glucose-Capillary: 126 mg/dL — ABNORMAL HIGH (ref 70–99)
Glucose-Capillary: 127 mg/dL — ABNORMAL HIGH (ref 70–99)
Glucose-Capillary: 132 mg/dL — ABNORMAL HIGH (ref 70–99)
Glucose-Capillary: 147 mg/dL — ABNORMAL HIGH (ref 70–99)
Glucose-Capillary: 91 mg/dL (ref 70–99)

## 2023-07-08 LAB — HEMOGLOBIN AND HEMATOCRIT, BLOOD
HCT: 25.6 % — ABNORMAL LOW (ref 39.0–52.0)
Hemoglobin: 9 g/dL — ABNORMAL LOW (ref 13.0–17.0)

## 2023-07-08 LAB — POCT I-STAT EG7
Acid-Base Excess: 0 mmol/L (ref 0.0–2.0)
Bicarbonate: 24.8 mmol/L (ref 20.0–28.0)
Calcium, Ion: 1.08 mmol/L — ABNORMAL LOW (ref 1.15–1.40)
HCT: 26 % — ABNORMAL LOW (ref 39.0–52.0)
Hemoglobin: 8.8 g/dL — ABNORMAL LOW (ref 13.0–17.0)
O2 Saturation: 87 %
Potassium: 4.1 mmol/L (ref 3.5–5.1)
Sodium: 137 mmol/L (ref 135–145)
TCO2: 26 mmol/L (ref 22–32)
pCO2, Ven: 42.1 mmHg — ABNORMAL LOW (ref 44–60)
pH, Ven: 7.379 (ref 7.25–7.43)
pO2, Ven: 54 mmHg — ABNORMAL HIGH (ref 32–45)

## 2023-07-08 LAB — PROTIME-INR
INR: 1.5 — ABNORMAL HIGH (ref 0.8–1.2)
Prothrombin Time: 18.1 seconds — ABNORMAL HIGH (ref 11.4–15.2)

## 2023-07-08 LAB — CBC
HCT: 32.3 % — ABNORMAL LOW (ref 39.0–52.0)
Hemoglobin: 11.1 g/dL — ABNORMAL LOW (ref 13.0–17.0)
MCH: 30.7 pg (ref 26.0–34.0)
MCHC: 34.4 g/dL (ref 30.0–36.0)
MCV: 89.2 fL (ref 80.0–100.0)
Platelets: 136 10*3/uL — ABNORMAL LOW (ref 150–400)
RBC: 3.62 MIL/uL — ABNORMAL LOW (ref 4.22–5.81)
RDW: 12.9 % (ref 11.5–15.5)
WBC: 16.9 10*3/uL — ABNORMAL HIGH (ref 4.0–10.5)
nRBC: 0 % (ref 0.0–0.2)

## 2023-07-08 LAB — ABO/RH: ABO/RH(D): A POS

## 2023-07-08 LAB — APTT: aPTT: 29 seconds (ref 24–36)

## 2023-07-08 LAB — PLATELET COUNT: Platelets: 163 10*3/uL (ref 150–400)

## 2023-07-08 SURGERY — CORONARY ARTERY BYPASS GRAFTING (CABG)
Anesthesia: General | Site: Chest

## 2023-07-08 MED ORDER — ALBUMIN HUMAN 5 % IV SOLN
250.0000 mL | INTRAVENOUS | Status: AC | PRN
  Administered 2023-07-08 (×5): 12.5 g via INTRAVENOUS
  Filled 2023-07-08 (×3): qty 250

## 2023-07-08 MED ORDER — METOPROLOL TARTRATE 5 MG/5ML IV SOLN: 2.5000 mg | INTRAVENOUS | Status: DC | PRN

## 2023-07-08 MED ORDER — SODIUM BICARBONATE 8.4 % IV SOLN
50.0000 meq | Freq: Once | INTRAVENOUS | Status: AC
  Administered 2023-07-08: 50 meq via INTRAVENOUS

## 2023-07-08 MED ORDER — HEPARIN SODIUM (PORCINE) 1000 UNIT/ML IJ SOLN
INTRAMUSCULAR | Status: AC
  Filled 2023-07-08: qty 1

## 2023-07-08 MED ORDER — MIDAZOLAM HCL (PF) 5 MG/ML IJ SOLN
INTRAMUSCULAR | Status: DC | PRN
  Administered 2023-07-08 (×5): 2 mg via INTRAVENOUS

## 2023-07-08 MED ORDER — FENTANYL CITRATE (PF) 250 MCG/5ML IJ SOLN
INTRAMUSCULAR | Status: AC
  Filled 2023-07-08: qty 5

## 2023-07-08 MED ORDER — CHLORHEXIDINE GLUCONATE 0.12 % MT SOLN: 15.0000 mL | Freq: Once | OROMUCOSAL | Status: DC

## 2023-07-08 MED ORDER — SODIUM CHLORIDE 0.9 % IV SOLN: 250.0000 mL | INTRAVENOUS | Status: DC

## 2023-07-08 MED ORDER — FINASTERIDE 5 MG PO TABS
5.0000 mg | ORAL_TABLET | Freq: Every day | ORAL | Status: DC
  Administered 2023-07-09 – 2023-07-11 (×3): 5 mg via ORAL
  Filled 2023-07-08 (×3): qty 1

## 2023-07-08 MED ORDER — FENTANYL CITRATE (PF) 250 MCG/5ML IJ SOLN
INTRAMUSCULAR | Status: DC | PRN
  Administered 2023-07-08: 100 ug via INTRAVENOUS
  Administered 2023-07-08: 150 ug via INTRAVENOUS
  Administered 2023-07-08 (×3): 100 ug via INTRAVENOUS
  Administered 2023-07-08 (×2): 50 ug via INTRAVENOUS
  Administered 2023-07-08: 150 ug via INTRAVENOUS
  Administered 2023-07-08: 100 ug via INTRAVENOUS
  Administered 2023-07-08: 250 ug via INTRAVENOUS
  Administered 2023-07-08: 100 ug via INTRAVENOUS

## 2023-07-08 MED ORDER — EZETIMIBE 10 MG PO TABS
10.0000 mg | ORAL_TABLET | Freq: Every day | ORAL | Status: DC
  Administered 2023-07-09 – 2023-07-12 (×4): 10 mg via ORAL
  Filled 2023-07-08 (×4): qty 1

## 2023-07-08 MED ORDER — PROPOFOL 10 MG/ML IV BOLUS
INTRAVENOUS | Status: AC
  Filled 2023-07-08: qty 20

## 2023-07-08 MED ORDER — VANCOMYCIN HCL IN DEXTROSE 1-5 GM/200ML-% IV SOLN
1000.0000 mg | Freq: Once | INTRAVENOUS | Status: AC
  Administered 2023-07-08: 1000 mg via INTRAVENOUS
  Filled 2023-07-08: qty 200

## 2023-07-08 MED ORDER — CHLORHEXIDINE GLUCONATE 4 % EX SOLN: 30.0000 mL | CUTANEOUS | Status: DC

## 2023-07-08 MED ORDER — TRAMADOL HCL 50 MG PO TABS: 50.0000 mg | ORAL_TABLET | Freq: Four times a day (QID) | ORAL | Status: DC | PRN

## 2023-07-08 MED ORDER — CHLORHEXIDINE GLUCONATE 0.12 % MT SOLN
15.0000 mL | Freq: Once | OROMUCOSAL | Status: AC
  Administered 2023-07-08: 15 mL via OROMUCOSAL
  Filled 2023-07-08: qty 15

## 2023-07-08 MED ORDER — LACTATED RINGERS IV SOLN: INTRAVENOUS | Status: DC

## 2023-07-08 MED ORDER — LIDOCAINE 2% (20 MG/ML) 5 ML SYRINGE
INTRAMUSCULAR | Status: AC
  Filled 2023-07-08: qty 5

## 2023-07-08 MED ORDER — MIDAZOLAM HCL 2 MG/2ML IJ SOLN: 2.0000 mg | INTRAMUSCULAR | Status: DC | PRN

## 2023-07-08 MED ORDER — CEFAZOLIN SODIUM-DEXTROSE 2-4 GM/100ML-% IV SOLN: 2.0000 g | INTRAVENOUS | Status: DC

## 2023-07-08 MED ORDER — PLASMA-LYTE A IV SOLN: INTRAVENOUS | Status: DC

## 2023-07-08 MED ORDER — ALBUMIN HUMAN 5 % IV SOLN: INTRAVENOUS | Status: DC | PRN

## 2023-07-08 MED ORDER — ASPIRIN 81 MG PO CHEW
324.0000 mg | CHEWABLE_TABLET | Freq: Once | ORAL | Status: AC
  Administered 2023-07-08: 324 mg via ORAL
  Filled 2023-07-08: qty 4

## 2023-07-08 MED ORDER — PHENYLEPHRINE HCL-NACL 20-0.9 MG/250ML-% IV SOLN
INTRAVENOUS | Status: DC | PRN
Start: 2023-07-08 — End: 2023-07-08
  Administered 2023-07-08: 50 ug/min via INTRAVENOUS

## 2023-07-08 MED ORDER — ACETAMINOPHEN 160 MG/5ML PO SOLN: 1000.0000 mg | Freq: Four times a day (QID) | ORAL | Status: DC

## 2023-07-08 MED ORDER — 0.9 % SODIUM CHLORIDE (POUR BTL) OPTIME
TOPICAL | Status: DC | PRN
  Administered 2023-07-08: 6000 mL

## 2023-07-08 MED ORDER — BISACODYL 10 MG RE SUPP: 10.0000 mg | Freq: Every day | RECTAL | Status: DC

## 2023-07-08 MED ORDER — DOCUSATE SODIUM 100 MG PO CAPS
200.0000 mg | ORAL_CAPSULE | Freq: Every day | ORAL | Status: DC
  Administered 2023-07-09 – 2023-07-12 (×4): 200 mg via ORAL
  Filled 2023-07-08 (×5): qty 2

## 2023-07-08 MED ORDER — PLASMA-LYTE A IV SOLN: INTRAVENOUS | Status: DC | PRN

## 2023-07-08 MED ORDER — VECURONIUM BROMIDE 10 MG IV SOLR
INTRAVENOUS | Status: AC
  Filled 2023-07-08: qty 10

## 2023-07-08 MED ORDER — ROCURONIUM BROMIDE 10 MG/ML (PF) SYRINGE
PREFILLED_SYRINGE | INTRAVENOUS | Status: DC | PRN
  Administered 2023-07-08: 30 mg via INTRAVENOUS
  Administered 2023-07-08: 70 mg via INTRAVENOUS

## 2023-07-08 MED ORDER — PROPOFOL 10 MG/ML IV BOLUS
INTRAVENOUS | Status: DC | PRN
Start: 2023-07-08 — End: 2023-07-08
  Administered 2023-07-08: 140 mg via INTRAVENOUS

## 2023-07-08 MED ORDER — MAGNESIUM SULFATE 4 GM/100ML IV SOLN
4.0000 g | Freq: Once | INTRAVENOUS | Status: AC
  Administered 2023-07-08: 4 g via INTRAVENOUS
  Filled 2023-07-08: qty 100

## 2023-07-08 MED ORDER — ACETAMINOPHEN 160 MG/5ML PO SOLN
650.0000 mg | Freq: Once | ORAL | Status: AC
  Administered 2023-07-08: 650 mg
  Filled 2023-07-08: qty 20.3

## 2023-07-08 MED ORDER — ONDANSETRON HCL 4 MG/2ML IJ SOLN: 4.0000 mg | Freq: Four times a day (QID) | INTRAMUSCULAR | Status: DC | PRN

## 2023-07-08 MED ORDER — ACETAMINOPHEN 500 MG PO TABS
1000.0000 mg | ORAL_TABLET | Freq: Four times a day (QID) | ORAL | Status: DC
  Administered 2023-07-08 – 2023-07-12 (×12): 1000 mg via ORAL
  Filled 2023-07-08 (×12): qty 2

## 2023-07-08 MED ORDER — MIDAZOLAM HCL (PF) 10 MG/2ML IJ SOLN
INTRAMUSCULAR | Status: AC
  Filled 2023-07-08: qty 2

## 2023-07-08 MED ORDER — CEFAZOLIN SODIUM-DEXTROSE 2-4 GM/100ML-% IV SOLN
2.0000 g | Freq: Three times a day (TID) | INTRAVENOUS | Status: AC
  Administered 2023-07-08 – 2023-07-10 (×6): 2 g via INTRAVENOUS
  Filled 2023-07-08 (×6): qty 100

## 2023-07-08 MED ORDER — POTASSIUM CHLORIDE 10 MEQ/50ML IV SOLN
10.0000 meq | INTRAVENOUS | Status: AC
  Administered 2023-07-08 (×3): 10 meq via INTRAVENOUS

## 2023-07-08 MED ORDER — LACTATED RINGERS IV SOLN: INTRAVENOUS | Status: DC | PRN

## 2023-07-08 MED ORDER — METOPROLOL TARTRATE 12.5 MG HALF TABLET: 12.5000 mg | ORAL_TABLET | Freq: Once | ORAL | Status: DC

## 2023-07-08 MED ORDER — THROMBIN (RECOMBINANT) 20000 UNITS EX SOLR
CUTANEOUS | Status: AC
  Filled 2023-07-08: qty 20000

## 2023-07-08 MED ORDER — METOPROLOL TARTRATE 12.5 MG HALF TABLET: 12.5000 mg | ORAL_TABLET | Freq: Two times a day (BID) | ORAL | Status: DC

## 2023-07-08 MED ORDER — ~~LOC~~ CARDIAC SURGERY, PATIENT & FAMILY EDUCATION
Freq: Once | Status: DC
  Filled 2023-07-08: qty 1

## 2023-07-08 MED ORDER — VECURONIUM BROMIDE 10 MG IV SOLR
INTRAVENOUS | Status: DC | PRN
  Administered 2023-07-08 (×2): 5 mg via INTRAVENOUS

## 2023-07-08 MED ORDER — CHLORHEXIDINE GLUCONATE CLOTH 2 % EX PADS
6.0000 | MEDICATED_PAD | Freq: Every day | CUTANEOUS | Status: DC
  Administered 2023-07-08 – 2023-07-11 (×4): 6 via TOPICAL

## 2023-07-08 MED ORDER — THROMBIN 20000 UNITS EX SOLR
OROMUCOSAL | Status: DC | PRN
  Administered 2023-07-08 (×3): 4 mL via TOPICAL

## 2023-07-08 MED ORDER — ORAL CARE MOUTH RINSE: 15.0000 mL | Freq: Once | OROMUCOSAL | Status: AC

## 2023-07-08 MED ORDER — PANTOPRAZOLE SODIUM 40 MG PO TBEC
40.0000 mg | DELAYED_RELEASE_TABLET | Freq: Every day | ORAL | Status: DC
  Administered 2023-07-10 – 2023-07-12 (×3): 40 mg via ORAL
  Filled 2023-07-08 (×3): qty 1

## 2023-07-08 MED ORDER — SODIUM CHLORIDE 0.45 % IV SOLN: INTRAVENOUS | Status: DC | PRN

## 2023-07-08 MED ORDER — MORPHINE SULFATE (PF) 2 MG/ML IV SOLN
1.0000 mg | INTRAVENOUS | Status: DC | PRN
  Administered 2023-07-08: 4 mg via INTRAVENOUS
  Administered 2023-07-09: 2 mg via INTRAVENOUS
  Filled 2023-07-08: qty 2
  Filled 2023-07-08: qty 1

## 2023-07-08 MED ORDER — SODIUM CHLORIDE 0.9% FLUSH
3.0000 mL | Freq: Two times a day (BID) | INTRAVENOUS | Status: DC
  Administered 2023-07-09: 3 mL via INTRAVENOUS
  Administered 2023-07-09: 10 mL via INTRAVENOUS
  Administered 2023-07-10: 3 mL via INTRAVENOUS

## 2023-07-08 MED ORDER — HEPARIN SODIUM (PORCINE) 1000 UNIT/ML IJ SOLN
INTRAMUSCULAR | Status: DC | PRN
  Administered 2023-07-08: 25000 [IU] via INTRAVENOUS

## 2023-07-08 MED ORDER — METOCLOPRAMIDE HCL 5 MG/ML IJ SOLN
10.0000 mg | Freq: Four times a day (QID) | INTRAMUSCULAR | Status: AC
  Administered 2023-07-08 – 2023-07-09 (×6): 10 mg via INTRAVENOUS
  Filled 2023-07-08 (×6): qty 2

## 2023-07-08 MED ORDER — PANTOPRAZOLE SODIUM 40 MG IV SOLR
40.0000 mg | Freq: Every day | INTRAVENOUS | Status: AC
  Administered 2023-07-08 – 2023-07-09 (×2): 40 mg via INTRAVENOUS
  Filled 2023-07-08 (×2): qty 10

## 2023-07-08 MED ORDER — NOREPINEPHRINE 4 MG/250ML-% IV SOLN: 0.0000 ug/min | INTRAVENOUS | Status: DC

## 2023-07-08 MED ORDER — ATORVASTATIN CALCIUM 40 MG PO TABS
40.0000 mg | ORAL_TABLET | Freq: Every day | ORAL | Status: DC
  Administered 2023-07-09 – 2023-07-12 (×4): 40 mg via ORAL
  Filled 2023-07-08 (×4): qty 1

## 2023-07-08 MED ORDER — OXYCODONE HCL 5 MG PO TABS
5.0000 mg | ORAL_TABLET | ORAL | Status: DC | PRN
  Administered 2023-07-08: 5 mg via ORAL
  Filled 2023-07-08: qty 1

## 2023-07-08 MED ORDER — PHENYLEPHRINE HCL-NACL 20-0.9 MG/250ML-% IV SOLN
0.0000 ug/min | INTRAVENOUS | Status: DC
  Administered 2023-07-08: 55 ug/min via INTRAVENOUS
  Administered 2023-07-08: 60 ug/min via INTRAVENOUS
  Filled 2023-07-08 (×2): qty 250

## 2023-07-08 MED ORDER — INSULIN REGULAR(HUMAN) IN NACL 100-0.9 UT/100ML-% IV SOLN: INTRAVENOUS | Status: DC

## 2023-07-08 MED ORDER — DEXTROSE 50 % IV SOLN: 0.0000 mL | INTRAVENOUS | Status: DC | PRN

## 2023-07-08 MED ORDER — ASPIRIN 325 MG PO TBEC
325.0000 mg | DELAYED_RELEASE_TABLET | Freq: Every day | ORAL | Status: DC
  Administered 2023-07-09 – 2023-07-12 (×4): 325 mg via ORAL
  Filled 2023-07-08 (×4): qty 1

## 2023-07-08 MED ORDER — METOPROLOL TARTRATE 25 MG/10 ML ORAL SUSPENSION: 12.5000 mg | Freq: Two times a day (BID) | ORAL | Status: DC

## 2023-07-08 MED ORDER — SODIUM CHLORIDE 0.9% FLUSH: 3.0000 mL | INTRAVENOUS | Status: DC | PRN

## 2023-07-08 MED ORDER — SODIUM CHLORIDE 0.9 % IV SOLN: INTRAVENOUS | Status: DC

## 2023-07-08 MED ORDER — DEXMEDETOMIDINE HCL IN NACL 400 MCG/100ML IV SOLN: 0.0000 ug/kg/h | INTRAVENOUS | Status: DC

## 2023-07-08 MED ORDER — BISACODYL 5 MG PO TBEC
10.0000 mg | DELAYED_RELEASE_TABLET | Freq: Every day | ORAL | Status: DC
  Administered 2023-07-09: 10 mg via ORAL
  Filled 2023-07-08 (×2): qty 2

## 2023-07-08 MED ORDER — CHLORHEXIDINE GLUCONATE 0.12 % MT SOLN
15.0000 mL | OROMUCOSAL | Status: AC
  Administered 2023-07-08: 15 mL via OROMUCOSAL
  Filled 2023-07-08: qty 15

## 2023-07-08 MED ORDER — PROTAMINE SULFATE 10 MG/ML IV SOLN
INTRAVENOUS | Status: DC | PRN
  Administered 2023-07-08: 220 mg via INTRAVENOUS

## 2023-07-08 MED ORDER — HEMOSTATIC AGENTS (NO CHARGE) OPTIME
TOPICAL | Status: DC | PRN
Start: 2023-07-08 — End: 2023-07-08
  Administered 2023-07-08: 1 via TOPICAL

## 2023-07-08 MED ORDER — ASPIRIN 81 MG PO CHEW: 324.0000 mg | CHEWABLE_TABLET | Freq: Every day | ORAL | Status: DC

## 2023-07-08 MED ORDER — PHENYLEPHRINE 80 MCG/ML (10ML) SYRINGE FOR IV PUSH (FOR BLOOD PRESSURE SUPPORT)
PREFILLED_SYRINGE | INTRAVENOUS | Status: DC | PRN
  Administered 2023-07-08 (×2): 80 ug via INTRAVENOUS
  Administered 2023-07-08: 160 ug via INTRAVENOUS
  Administered 2023-07-08: 80 ug via INTRAVENOUS

## 2023-07-08 SURGICAL SUPPLY — 106 items
ADH SKN CLS APL DERMABOND .7 (GAUZE/BANDAGES/DRESSINGS) ×2
BAG DECANTER FOR FLEXI CONT (MISCELLANEOUS) ×3 IMPLANT
BLADE CLIPPER SURG (BLADE) ×3 IMPLANT
BLADE STERNUM SYSTEM 6 (BLADE) ×3 IMPLANT
BLADE SURG 11 STRL SS (BLADE) IMPLANT
BNDG CMPR 5X4 KNIT ELC UNQ LF (GAUZE/BANDAGES/DRESSINGS) ×2
BNDG CMPR 6 X 5 YARDS HK CLSR (GAUZE/BANDAGES/DRESSINGS) ×2
BNDG ELASTIC 4INX 5YD STR LF (GAUZE/BANDAGES/DRESSINGS) IMPLANT
BNDG ELASTIC 6INX 5YD STR LF (GAUZE/BANDAGES/DRESSINGS) IMPLANT
BNDG GAUZE DERMACEA FLUFF 4 (GAUZE/BANDAGES/DRESSINGS) ×3 IMPLANT
BNDG GZE DERMACEA 4 6PLY (GAUZE/BANDAGES/DRESSINGS) ×2
CANISTER SUCT 3000ML PPV (MISCELLANEOUS) ×3 IMPLANT
CANNULA ARTERIAL NVNT 3/8 20FR (MISCELLANEOUS) IMPLANT
CANNULA ARTERIAL VENT 3/8 20FR (CANNULA) IMPLANT
CATH ROBINSON RED A/P 18FR (CATHETERS) ×6 IMPLANT
CATH THORACIC 28FR (CATHETERS) ×3 IMPLANT
CATH THORACIC 36FR (CATHETERS) ×3 IMPLANT
CATH THORACIC 36FR RT ANG (CATHETERS) ×3 IMPLANT
CLIP TI MEDIUM 24 (CLIP) IMPLANT
CLIP TI WIDE RED SMALL 24 (CLIP) IMPLANT
CONTAINER PROTECT SURGISLUSH (MISCELLANEOUS) ×6 IMPLANT
COVER CAMERA OR STL DISP (MISCELLANEOUS) IMPLANT
DERMABOND ADVANCED .7 DNX12 (GAUZE/BANDAGES/DRESSINGS) IMPLANT
DRAPE CARDIOVASCULAR INCISE (DRAPES) ×2
DRAPE SRG 135X102X78XABS (DRAPES) ×3 IMPLANT
DRAPE WARM FLUID 44X44 (DRAPES) ×3 IMPLANT
DRSG COVADERM 4X14 (GAUZE/BANDAGES/DRESSINGS) ×3 IMPLANT
ELECT CAUTERY BLADE 6.4 (BLADE) ×3 IMPLANT
ELECT REM PT RETURN 9FT ADLT (ELECTROSURGICAL) ×4
ELECTRODE REM PT RTRN 9FT ADLT (ELECTROSURGICAL) ×6 IMPLANT
FELT TEFLON 1X6 (MISCELLANEOUS) ×3 IMPLANT
GAUZE 4X4 16PLY ~~LOC~~+RFID DBL (SPONGE) IMPLANT
GAUZE SPONGE 4X4 12PLY STRL (GAUZE/BANDAGES/DRESSINGS) ×6 IMPLANT
GAUZE SPONGE 4X4 12PLY STRL LF (GAUZE/BANDAGES/DRESSINGS) IMPLANT
GLOVE BIO SURGEON STRL SZ 6 (GLOVE) IMPLANT
GLOVE BIO SURGEON STRL SZ 6.5 (GLOVE) IMPLANT
GLOVE BIO SURGEON STRL SZ7 (GLOVE) IMPLANT
GLOVE BIO SURGEON STRL SZ7.5 (GLOVE) IMPLANT
GLOVE BIOGEL PI IND STRL 6 (GLOVE) IMPLANT
GLOVE BIOGEL PI IND STRL 6.5 (GLOVE) IMPLANT
GLOVE BIOGEL PI IND STRL 7.0 (GLOVE) IMPLANT
GLOVE ECLIPSE 7.0 STRL STRAW (GLOVE) ×6 IMPLANT
GLOVE ORTHO TXT STRL SZ7.5 (GLOVE) IMPLANT
GOWN STRL REUS W/ TWL LRG LVL3 (GOWN DISPOSABLE) ×12 IMPLANT
GOWN STRL REUS W/ TWL XL LVL3 (GOWN DISPOSABLE) ×3 IMPLANT
GOWN STRL REUS W/TWL LRG LVL3 (GOWN DISPOSABLE) ×12
GOWN STRL REUS W/TWL XL LVL3 (GOWN DISPOSABLE) ×4
HEMOSTAT POWDER SURGIFOAM 1G (HEMOSTASIS) ×9 IMPLANT
HEMOSTAT SURGICEL 2X14 (HEMOSTASIS) ×3 IMPLANT
INSERT FOGARTY 61MM (MISCELLANEOUS) IMPLANT
INSERT FOGARTY XLG (MISCELLANEOUS) IMPLANT
KIT BASIN OR (CUSTOM PROCEDURE TRAY) ×3 IMPLANT
KIT SUCTION CATH 14FR (SUCTIONS) ×3 IMPLANT
KIT TURNOVER KIT B (KITS) ×3 IMPLANT
KIT VASOVIEW HEMOPRO 2 VH 4000 (KITS) ×3 IMPLANT
KNIFE MICRO-UNI 3.5 30 DEG (BLADE) ×3 IMPLANT
NS IRRIG 1000ML POUR BTL (IV SOLUTION) ×15 IMPLANT
PACK E OPEN HEART (SUTURE) ×3 IMPLANT
PACK OPEN HEART (CUSTOM PROCEDURE TRAY) ×3 IMPLANT
PAD ARMBOARD 7.5X6 YLW CONV (MISCELLANEOUS) ×6 IMPLANT
PAD ELECT DEFIB RADIOL ZOLL (MISCELLANEOUS) ×3 IMPLANT
PENCIL BUTTON HOLSTER BLD 10FT (ELECTRODE) ×3 IMPLANT
POSITIONER HEAD DONUT 9IN (MISCELLANEOUS) ×3 IMPLANT
PUNCH AORTIC ROTATE 4.0MM (MISCELLANEOUS) IMPLANT
PUNCH AORTIC ROTATE 4.5MM 8IN (MISCELLANEOUS) ×3 IMPLANT
PUNCH AORTIC ROTATE 5MM 8IN (MISCELLANEOUS) IMPLANT
SET MPS 3-ND DEL (MISCELLANEOUS) IMPLANT
SPONGE INTESTINAL PEANUT (DISPOSABLE) IMPLANT
SPONGE T-LAP 18X18 ~~LOC~~+RFID (SPONGE) IMPLANT
SPONGE T-LAP 4X18 ~~LOC~~+RFID (SPONGE) IMPLANT
SUPPORT HEART JANKE-BARRON (MISCELLANEOUS) ×3 IMPLANT
SUT BONE WAX W31G (SUTURE) ×3 IMPLANT
SUT MNCRL AB 4-0 PS2 18 (SUTURE) IMPLANT
SUT PROLENE 3 0 SH DA (SUTURE) IMPLANT
SUT PROLENE 3 0 SH1 36 (SUTURE) ×3 IMPLANT
SUT PROLENE 4 0 RB 1 (SUTURE)
SUT PROLENE 4 0 SH DA (SUTURE) IMPLANT
SUT PROLENE 4-0 RB1 .5 CRCL 36 (SUTURE) IMPLANT
SUT PROLENE 5 0 C 1 36 (SUTURE) IMPLANT
SUT PROLENE 6 0 C 1 30 (SUTURE) IMPLANT
SUT PROLENE 7 0 BV 1 (SUTURE) IMPLANT
SUT PROLENE 7 0 BV1 MDA (SUTURE) ×3 IMPLANT
SUT PROLENE 8 0 BV175 6 (SUTURE) IMPLANT
SUT SILK 1 MH (SUTURE) IMPLANT
SUT SILK 2 0 SH (SUTURE) IMPLANT
SUT STEEL 6MS V (SUTURE) IMPLANT
SUT STEEL STERNAL CCS#1 18IN (SUTURE) IMPLANT
SUT STEEL SZ 6 DBL 3X14 BALL (SUTURE) IMPLANT
SUT VIC AB 1 CTX 36 (SUTURE) ×4
SUT VIC AB 1 CTX36XBRD ANBCTR (SUTURE) ×6 IMPLANT
SUT VIC AB 2-0 CT1 27 (SUTURE) ×2
SUT VIC AB 2-0 CT1 TAPERPNT 27 (SUTURE) IMPLANT
SUT VIC AB 2-0 CTX 27 (SUTURE) IMPLANT
SUT VIC AB 3-0 SH 27 (SUTURE)
SUT VIC AB 3-0 SH 27X BRD (SUTURE) IMPLANT
SUT VIC AB 3-0 X1 27 (SUTURE) IMPLANT
SUT VICRYL 4-0 PS2 18IN ABS (SUTURE) IMPLANT
SYSTEM SAHARA CHEST DRAIN ATS (WOUND CARE) ×3 IMPLANT
TAPE CLOTH SURG 4X10 WHT LF (GAUZE/BANDAGES/DRESSINGS) IMPLANT
TAPE PAPER 2X10 WHT MICROPORE (GAUZE/BANDAGES/DRESSINGS) IMPLANT
TOWEL GREEN STERILE (TOWEL DISPOSABLE) ×3 IMPLANT
TOWEL GREEN STERILE FF (TOWEL DISPOSABLE) ×3 IMPLANT
TRAY FOLEY SLVR 16FR TEMP STAT (SET/KITS/TRAYS/PACK) ×3 IMPLANT
TUBING LAP HI FLOW INSUFFLATIO (TUBING) ×3 IMPLANT
UNDERPAD 30X36 HEAVY ABSORB (UNDERPADS AND DIAPERS) ×3 IMPLANT
WATER STERILE IRR 1000ML POUR (IV SOLUTION) ×6 IMPLANT

## 2023-07-08 NOTE — Hospital Course (Addendum)
HPI: This is an 82 year old male with a past medical history of CAD (s/p angioplasty to LAD in 1990), HTN, and HLD. He presented to the ED on 07/27 with complaints of palpitations, weakness and lightheadedness that had happened 3 days prior and lasted about 1 hour with another episode on the 27th that lasted about 3 hours which prompted him to seek care at [facility] ED. He denies chest pain, shortness of breath and dizziness. He does admit to a couple possible episodes a few weeks prior. His Troponin I (high sensitivity) while in the ED peaked at 183. EKG showed a normal sinus rhythm with no sign of acute ischemia. He was admitted to the hospital for further workup. He has a history of NSVT and junctional rhythm that was found on a 14 day heart monitor in March 2024. His nuclear scan and echo in 2023 were normal/stable. He was transferred to [facility] for further workup. CT morphology scan showed moderate to severe mixed left man and multivessel CAD and his calcium score was 4273 placing him in the 94th percentile. He then underwent heart catheterization on 07/30 which showed severe multivessel disease including 50-60% stenosis of the LAD, 80% stenosis of the proximal circumflex and chronic total occlusion of the right coronary artery collateralized by the left circumflex. Echocardiogram on 07/28 had an estimated LVEF of 60-65%, grade 1 diastolic dysfunction, and no valvular abnormalities. He is active and still uses his treadmill at home often.   Hospital Course: Patient underwent a CABG x 2. He was transported from the OR to [facility] ICU in stable condition. He was extubated the afternoon of surgery.  He has remained hemodynamically stable but on postop day 1 his heart rate was in the 60s and beta-blocker was not initiated at that time.  Swan-Ganz , arterial line and chest tubes were removed on postop day #1.  He was noted to have a expected postoperative volume overload and a course of diuretics was  initiated.  Renal function has remained within normal limits.  He does have an expected acute blood loss anemia which is stable and being monitored clinically.  In the ICU in OR he was managed with usual insulin pump protocol but this was transitioned off and sliding scale was initiated on postop day 1 with plans to discontinue.  He is not a known diabetic and hemoglobin A1c preoperatively was 5.6.

## 2023-07-08 NOTE — Procedures (Addendum)
Extubation Procedure Note  Patient Details:   Name: Zavion Sabatini DOB: 03-03-1941 MRN: [id number]   Airway Documentation:    Vent end date: 07/08/23 Vent end time: 1600   Evaluation  O2 sats: stable throughout Complications: No apparent complications Patient did tolerate procedure well. Bilateral Breath Sounds: Clear, Diminished   Yes  Pt extubated to 2L [place], pt tolerated well. Cuff leak present, no stridor noted, RN at bedside, RT will monitor as needed.   Thornell Mule 07/08/2023, 4:47 PM

## 2023-07-08 NOTE — Anesthesia Procedure Notes (Signed)
Central Venous Catheter Insertion Performed by: Atilano Median, DO, anesthesiologist Start/End8/15/2024 6:52 AM, 07/08/2023 7:05 AM Patient location: Pre-op. Preanesthetic checklist: patient identified, IV checked, site marked, risks and benefits discussed, surgical consent, monitors and equipment checked, pre-op evaluation, timeout performed and anesthesia consent Position: Trendelenburg Lidocaine 1% used for infiltration and patient sedated Hand hygiene performed  and maximum sterile barriers used  Catheter size: 8.5 Fr Sheath introducer Procedure performed using ultrasound guided technique. Ultrasound Notes:anatomy identified, needle tip was noted to be adjacent to the nerve/plexus identified, no ultrasound evidence of intravascular and/or intraneural injection and image(s) printed for medical record Attempts: 1 Following insertion, line sutured, dressing applied and Biopatch. Post procedure assessment: blood return through all ports, free fluid flow and no air  Patient tolerated the procedure well with no immediate complications.

## 2023-07-08 NOTE — Anesthesia Procedure Notes (Signed)
Central Venous Catheter Insertion Performed by: Atilano Median, DO, anesthesiologist Start/End8/15/2024 7:05 AM, 07/08/2023 7:06 AM Patient location: Pre-op. Preanesthetic checklist: patient identified, IV checked, site marked, risks and benefits discussed, surgical consent, monitors and equipment checked, pre-op evaluation, timeout performed and anesthesia consent Position: supine Hand hygiene performed  and maximum sterile barriers used  PA cath was placed.Swan type:thermodilution PA Cath depth:45 Procedure performed without using ultrasound guided technique. Attempts: 1 Patient tolerated the procedure well with no immediate complications.

## 2023-07-08 NOTE — Anesthesia Procedure Notes (Signed)
Arterial Line Insertion Start/End8/15/2024 6:49 AM, 07/08/2023 6:52 AM Performed by: Samara Deist, CRNA, CRNA  Patient location: Pre-op. Preanesthetic checklist: patient identified, IV checked, site marked, risks and benefits discussed, surgical consent, monitors and equipment checked, pre-op evaluation, timeout performed and anesthesia consent Lidocaine 1% used for infiltration Right, radial was placed Catheter size: 20 G Hand hygiene performed  and maximum sterile barriers used   Attempts: 2 Procedure performed without using ultrasound guided technique. Following insertion, dressing applied and Biopatch. Post procedure assessment: normal and unchanged  Patient tolerated the procedure well with no immediate complications.

## 2023-07-08 NOTE — Progress Notes (Signed)
Patient ID: Dustin Mcclure, male   DOB: 1941/02/13, 82 y.o.   MRN: [id number]  TCTS Evening Rounds:   Hemodynamically stable  CI = 3  Extubated and alert   Urine output good  CT output low  CBC    Component Value Date/Time   WBC 16.9 (H) 07/08/2023 1234   RBC 3.62 (L) 07/08/2023 1234   HGB 8.8 (L) 07/08/2023 1602   HCT 26.0 (L) 07/08/2023 1602   PLT 136 (L) 07/08/2023 1234   MCV 89.2 07/08/2023 1234   MCH 30.7 07/08/2023 1234   MCHC 34.4 07/08/2023 1234   RDW 12.9 07/08/2023 1234   LYMPHSABS 1.9 06/20/2023 0535   MONOABS 0.5 06/20/2023 0535   EOSABS 0.0 06/20/2023 0535   BASOSABS 0.0 06/20/2023 0535     BMET    Component Value Date/Time   NA 138 07/08/2023 1602   NA 140 06/15/2023 0824   K 4.0 07/08/2023 1602   CL 101 07/08/2023 1108   CO2 24 07/06/2023 1430   GLUCOSE 135 (H) 07/08/2023 1108   BUN 12 07/08/2023 1108   BUN 14 06/15/2023 0824   CREATININE 0.50 (L) 07/08/2023 1108   CALCIUM 9.2 07/06/2023 1430   EGFR 89 06/15/2023 0824   GFRNONAA >60 07/06/2023 1430     A/P:  Stable postop course. Continue current plans

## 2023-07-08 NOTE — Brief Op Note (Signed)
07/08/2023  10:40 AM  PATIENT:  Dustin Mcclure  82 y.o. male  PRE-OPERATIVE DIAGNOSIS:  Coronary artery disease  POST-OPERATIVE DIAGNOSIS:  Coronary artery disease  PROCEDURE:TRANSESOPHAGEAL ECHOCARDIOGRAM, CORONARY ARTERY BYPASS GRAFTING (CABG) x2 (LIMA to LAD, SVG to OM) USING LEFT INTERNAL MAMMARY ARTERY (LIMA) AND ENDOSCOPICALLY HARVESTED RIGHT GREATER SAPHENOUS VEIN   Vein harvest time: 20 min Vein prep time: 14 min  SURGEON:  Surgeons and Role:    [name], MD - Primary  PHYSICIAN ASSISTANT: Doree Fudge PA-C  ASSISTANTS: Benay Spice RNFA   ANESTHESIA:   general  EBL:  Per anesthesia, perfusion record  DRAINS:  Chest tubes placed in the mediastinal and pleural spaces    COUNTS CORRECT:  YES  DICTATION: .Dragon Dictation  PLAN OF CARE: Admit to inpatient   PATIENT DISPOSITION:  ICU - intubated and hemodynamically stable.   Delay start of Pharmacological VTE agent (>24hrs) due to surgical blood loss or risk of bleeding: no  BASELINE WEIGHT: 77 kg

## 2023-07-08 NOTE — Anesthesia Procedure Notes (Signed)
Procedure Name: Intubation Date/Time: 07/08/2023 7:55 AM  Performed by: Samara Deist, CRNAPre-anesthesia Checklist: Patient identified, Emergency Drugs available, Suction available and Patient being monitored Patient Re-evaluated:Patient Re-evaluated prior to induction Oxygen Delivery Method: Circle System Utilized Preoxygenation: Pre-oxygenation with 100% oxygen Induction Type: IV induction Ventilation: Oral airway inserted - appropriate to patient size Laryngoscope Size: Mac and 4 Grade View: Grade I Tube type: Oral Number of attempts: 1 Airway Equipment and Method: Stylet and Oral airway Placement Confirmation: ETT inserted through vocal cords under direct vision, positive ETCO2 and breath sounds checked- equal and bilateral Secured at: 22 cm Tube secured with: Tape Dental Injury: Teeth and Oropharynx as per pre-operative assessment

## 2023-07-08 NOTE — Progress Notes (Signed)
  Echocardiogram Echocardiogram Transesophageal has been performed.  Delcie Roch 07/08/2023, 2:36 PM

## 2023-07-08 NOTE — Interval H&P Note (Signed)
History and Physical Interval Note:  07/08/2023 6:33 AM  Dustin Mcclure  has presented today for surgery, with the diagnosis of CAD.  The various methods of treatment have been discussed with the patient and family. After consideration of risks, benefits and other options for treatment, the patient has consented to  Procedure(s): CORONARY ARTERY BYPASS GRAFTING (CABG) (N/A) TRANSESOPHAGEAL ECHOCARDIOGRAM (N/A) as a surgical intervention.  The patient's history has been reviewed, patient examined, no change in status, stable for surgery.  I have reviewed the patient's chart and labs.  Questions were answered to the patient's satisfaction.     [name]

## 2023-07-08 NOTE — Plan of Care (Signed)
  Problem: Cardiovascular: Goal: Ability to achieve and maintain adequate cardiovascular perfusion will improve Outcome: Progressing   

## 2023-07-08 NOTE — Discharge Summary (Signed)
[address]       [place] [address]             [phone]    Physician Discharge Summary  Patient ID: Dustin Mcclure MRN: [id number] DOB/AGE: Jan 25, 1941 82 y.o.  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   Palpitations 06/23/2023   Elevated troponin 06/19/2023   Essential hypertension 06/19/2023   Mixed hyperlipidemia 06/19/2023   Coronary artery disease involving native coronary artery of native heart 06/19/2023   GERD without esophagitis 06/19/2023   Bilateral inguinal hernia (BIH) 06/21/2018     Discharge Diagnoses:  Patient Active Problem List   Diagnosis Date Noted   Coronary artery disease 07/08/2023   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 He presented to the ED on 07/27 with complaints of palpitations, weakness and lightheadedness that had happened 3 days prior and lasted about 1 hour with another episode on the 27th that lasted about 3 hours which prompted him to seek care at [facility] ED. He denies chest pain, shortness of breath and dizziness. He does admit to a couple possible episodes a few weeks prior. His Troponin I (high sensitivity) while in the ED peaked at 183. EKG showed a normal sinus rhythm with no sign of acute ischemia. He was admitted to the hospital for further workup. He has a history of NSVT and junctional rhythm that was found on a 14 day heart  monitor in March 2024. His nuclear scan and echo in 2023 were normal/stable. He was transferred to [facility] for further workup. CT morphology scan showed moderate to severe mixed left man and multivessel CAD and his calcium score was 4273 placing him in the 94th percentile. He then underwent heart catheterization on 07/30 which showed severe multivessel disease including 50-60% stenosis of the LAD, 80% stenosis of the proximal circumflex and chronic total occlusion of the right coronary artery collateralized by the left circumflex. Echocardiogram on 07/28 had an estimated LVEF of 60-65%, grade 1 diastolic dysfunction, and no valvular abnormalities. He is active and still uses his treadmill at home often.   Hospital Course: Patient underwent a CABG x 2. He was transported from the OR to [facility] ICU in stable condition. He was extubated the afternoon of surgery.  He has remained hemodynamically stable but on postop day 1 his heart rate was in the 60s and beta-blocker was not initiated at that time.  Swan-Ganz , arterial line and chest tubes were removed on postop day #1.  He was noted to have a expected postoperative volume overload and a course of diuretics was initiated.  Renal function has remained within normal limits.  He does have an expected acute blood loss anemia which is stable and being monitored clinically.  In the ICU in OR he was managed with usual insulin pump protocol but this was transitioned off and sliding scale was initiated on  postop day 1 with plans to discontinue.  He is not a known diabetic and hemoglobin A1c preoperatively was 5.6.  He remained in NSR and was stable for transfer to the  progressive care unit on 07/10/2023, however he remained in ICU due to bed availability.  His HR and blood pressure improved.  He was started on Lopressor at a reduced dose.  His pacing wires were removed without difficulty. He had multiple bowel movements. He has been ambulating on room air with good  oxygenation. Sternal and right lower extremity wounds are clean, dry, healing without signs of infection. Small wound right lower extremity with minor sero sanguinous drainage. He is felt stable for discharge today.  Consults: None  Significant Diagnostic Studies: angiography:     Mid LAD-1 lesion is 50% stenosed.   Mid LAD-2 lesion is 60% stenosed.   Prox RCA lesion is 99% stenosed.   Mid RCA lesion is 100% stenosed.   Prox Cx lesion is 80% stenosed.   1.  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 2.  LVEDP of 22 mmHg.   Recommendation: Surgical revascularization.  Treatments: surgery:  07/08/2023   Surgeon:  [name], MD   First Assistant: Doree Fudge,  PA-C:   An experienced assistant was required given the complexity of this surgery and the standard of surgical care. The assistant was needed for endoscopic vein harvest, exposure, dissection, suctioning, retraction of delicate tissues and sutures, instrument exchange and for overall help during this procedure.     Preoperative Diagnosis:  Severe multi-vessel coronary artery disease     Postoperative Diagnosis:  Same     Procedure:   Median Sternotomy Extracorporeal circulation 3.   Coronary artery bypass grafting x 2   Left internal mammary artery graft to the LAD SVG to OM     4.   Endoscopic vein harvest from the right leg  Discharge Exam: Blood pressure 125/78, pulse 75, temperature 98.1 F (36.7 C), temperature source Oral, resp. rate (!) 26, height 5\' 8"  (1.727 m), weight 77.4 kg, SpO2 98%.  General appearance: alert, cooperative, and no distress Heart: RRR Lungs: clear to auscultation bilaterally Abdomen: Soft, non tender, bowel sounds present Extremities: Compression stockings in place, + edema Wounds: Sternal and RLE wounds are  clean and dry. Small right lowe extremity wound (where greater saphenous vein was disconnected) with minor sero sanguinous drainage   Discharge Medications:  The patient has been discharged on:   1.Beta Blocker:  Yes [ x  ]                              No   [   ]                              If No, reason:  2.Ace Inhibitor/ARB: Yes [   ]                                     No  [ x   ]  If No, reason:  3.Statin:   Yes [  x ]                  No  [   ]                  If No, reason:  4.Ecasa:  Yes  [  x ]                  No   [   ]                  If No, reason:  Patient had ACS upon admission:No  Plavix/P2Y12 inhibitor: Yes [   ]                                      No  [ x  ]      Allergies as of 07/12/2023   No Known Allergies      Medication List     STOP taking these medications    amLODipine 5 MG tablet Commonly known as: NORVASC   nitroGLYCERIN 0.4 MG SL tablet Commonly known as: NITROSTAT       TAKE these medications    aspirin EC 325 MG tablet Take 1 tablet (325 mg total) by mouth daily. What changed:  medication strength how much to take additional instructions   atorvastatin 40 MG tablet Commonly known as: LIPITOR Take 1 tablet (40 mg total) by mouth daily.   calcium carbonate 500 MG chewable tablet Commonly known as: TUMS - dosed in mg elemental calcium Chew 2 tablets by mouth daily as needed for indigestion.   ezetimibe 10 MG tablet Commonly known as: ZETIA Take 1 tablet (10 mg total) by mouth daily.   finasteride 5 MG tablet Commonly known as: PROSCAR Take 5 mg by mouth daily in the afternoon.   furosemide 40 MG tablet Commonly known as: LASIX Take 1 tablet (40 mg total) by mouth daily. For 5 days then stop.   metoprolol succinate 50 MG 24 hr tablet Commonly known as: TOPROL-XL Take 25 mg by mouth in the morning.   omeprazole 20 MG capsule Commonly known as: PRILOSEC Take 20 mg by  mouth daily as needed (acid reflux).   potassium chloride SA 20 MEQ tablet Commonly known as: KLOR-CON M Take 1 tablet (20 mEq total) by mouth daily. For 5 days then stop. Start taking on: July 13, 2023   traMADol 50 MG tablet Commonly known as: ULTRAM Take 1 tablet (50 mg total) by mouth every 6 (six) hours as needed for moderate pain.        Follow-up Information     [facility]. Go on 07/22/2023.   Specialty: Cardiothoracic Surgery Why: Appointment is with nurse for chest tube suture removal. Appointment time is at 11:00 am Contact information: [address] [place], [address] [place] [address] [phone]        [facility]. Go on 08/18/2023.   Why: Please arrive by 12:15 pm  on 09/25 in order for PA/LAT CXR to be taken PRIOR to office appointment with Dr. Laneta Simmers. Contact information: [address] [place] [address]        [name], MD. Go on 08/18/2023.   Specialty: Cardiothoracic Surgery Why: Appointment time is at 1:30 pm Contact information: [address]  [place] [address] [place] [address] [phone]         Cannon Kettle, PA-C. Go on 07/28/2023.   Specialty: Internal Medicine Why: Appointment time is at 8:25 am Contact information: [address] [place] [address] [phone]                 Signed:  Elenore Rota 07/12/2023, 7:43 AM

## 2023-07-08 NOTE — Discharge Instructions (Addendum)
Discharge Instructions:  1. You may shower, please wash incisions daily with soap and water and keep dry.  If you wish to cover wounds with dressing you may do so but please keep clean and change daily.  No tub baths or swimming until incisions have completely healed.  If your incisions become red or develop any drainage please call our office at [phone]  2. No Driving until cleared by Dr. Bartle's office and you are no longer using narcotic pain medications  3. Monitor your weight daily.. Please use the same scale and weigh at same time... If you gain 5-10 lbs in 48 hours with associated lower extremity swelling, please contact our office at [phone]  4. Fever of 101.5 for at least 24 hours with no source, please contact our office at [phone]  5. Activity- up as tolerated, please walk at least 3 times per day.  Avoid strenuous activity, no lifting, pushing, or pulling with your arms over 8-10 lbs for a minimum of 6 weeks  6. If any questions or concerns arise, please do not hesitate to contact our office at [phone]  

## 2023-07-08 NOTE — Progress Notes (Signed)
Pt achieved a NIF of -30 and VC of 2.98 with great pt effort on all attempts. RN at bedside RT will monitor as needed.

## 2023-07-08 NOTE — Op Note (Signed)
CARDIOVASCULAR SURGERY OPERATIVE NOTE  07/08/2023  Surgeon:  [name], MD  First Assistant: Doree Fudge,  PA-C:   An experienced assistant was required given the complexity of this surgery and the standard of surgical care. The assistant was needed for endoscopic vein harvest, exposure, dissection, suctioning, retraction of delicate tissues and sutures, instrument exchange and for overall help during this procedure.   Preoperative Diagnosis:  Severe multi-vessel coronary artery disease   Postoperative Diagnosis:  Same   Procedure:  Median Sternotomy Extracorporeal circulation 3.   Coronary artery bypass grafting x 2  Left internal mammary artery graft to the LAD SVG to OM   4.   Endoscopic vein harvest from the right leg   Anesthesia:  General Endotracheal   Clinical History/Surgical Indication:  This 82 year old fairly active gentleman presented with an episode of tachypalpitations and weakness and had a positive troponin of 183. ECG unremarkable. Calcium scoring CT 4273 94% percentile with moderate to severe mixed LM and multivessel CAD. CT FFR showed significant stenosis in the proximal to mid LAD and proximal LCX, RCA normal. Cath showed 50% -60% mid LAD stenoses. LCX has 80% proximal stenosis. RCA with mid occlusion, collateralized from the LCX. LVEDP 22. He denies having any CP, SOB or fatigue with low level exercise walking on his treadmill. I agree that CABG is indicated for his significant stenoses to prevent further ischemia and infarction. I discussed the operative procedure with the patient and family including alternatives, benefits and risks; including but not limited to bleeding, blood transfusion, infection, stroke, myocardial infarction, graft failure, heart block requiring a permanent pacemaker, organ dysfunction, and death. Dmani Latka understands and agrees  to proceed.   Preparation:  The patient was seen in the preoperative holding area and the correct patient, correct operation were confirmed with the patient after reviewing the medical record and catheterization. The consent was signed by me. Preoperative antibiotics were given. A pulmonary arterial line and radial arterial line were placed by the anesthesia team. The patient was taken back to the operating room and positioned supine on the operating room table. After being placed under general endotracheal anesthesia by the anesthesia team a foley catheter was placed. The neck, chest, abdomen, and both legs were prepped with betadine soap and solution and draped in the usual sterile manner. A surgical time-out was taken and the correct patient and operative procedure were confirmed with the nursing and anesthesia staff.   Cardiopulmonary Bypass:  A median sternotomy was performed. The pericardium was opened in the midline. Right ventricular function appeared normal. The ascending aorta was of normal size and had no palpable plaque. There were no contraindications to aortic cannulation or cross-clamping. The patient was fully systemically heparinized and the ACT was maintained > 400 sec. The proximal aortic arch was cannulated with a 20 F aortic cannula for arterial inflow. Venous cannulation was performed via the right atrial appendage using a two-staged venous cannula. An antegrade cardioplegia/vent cannula was inserted into the mid-ascending aorta. Aortic occlusion was performed with a single cross-clamp. Systemic cooling to 32 degrees Centigrade and topical cooling of the heart with iced saline were used. Hyperkalemic antegrade cold blood cardioplegia was used to induce diastolic arrest and was then given at about 20 minute intervals throughout the period of arrest to maintain myocardial temperature at or below 10 degrees centigrade. A temperature probe was inserted into the interventricular septum and  an insulating pad was placed in the pericardium.   Left internal mammary artery  harvest:  The left side of the sternum was retracted using the Rultract retractor. The left internal mammary artery was harvested as a pedicle graft. All side branches were clipped. It was a medium-sized vessel of good quality with excellent blood flow. It was ligated distally and divided. It was sprayed with topical papaverine solution to prevent vasospasm.   Endoscopic vein harvest: Performed by Doree Fudge, PA-C  The right greater saphenous vein was harvested endoscopically through a 2 cm incision medial to the right knee. It was harvested from the upper thigh to below the knee. It was a medium-sized vein of good quality. The side branches were all ligated with 4-0 silk ties.    Coronary arteries:  The coronary arteries were examined.  LAD:  diffusely diseased but graftable distally LCX:  OM is a large vessel with no distal disease RCA:  diffusely calcified, small PDA, not graftable but well-collateralized from LCx.   Grafts: assisted on all grafts by Doree Fudge, PA-C  LIMA to the LAD: 1.75 mm. It was sewn end to side using 8-0 prolene continuous suture. SVG to OM:  2.5 mm. It was sewn end to side using 7-0 prolene continuous suture.    The proximal vein graft anastomoses were performed to the mid-ascending aorta using continuous 6-0 prolene suture. Graft markers were placed around the proximal anastomoses.   Completion:  The patient was rewarmed to 37 degrees Centigrade. The clamp was removed from the LIMA pedicle and there was rapid warming of the septum and return of ventricular fibrillation. The crossclamp was removed with a time of 49 minutes. There was spontaneous return of sinus rhythm. The distal and proximal anastomoses were checked for hemostasis. The position of the grafts was satisfactory. Two temporary epicardial pacing wires were placed on the right atrium and two on the  right ventricle. The patient was weaned from CPB without difficulty on no inotropes. CPB time was 64 minutes. Cardiac output was 5 LPM. TEE showed normal LV systolic function, trivial unchanged central AI. Heparin was fully reversed with protamine and the aortic and venous cannulas removed. Hemostasis was achieved. Mediastinal and left pleural drainage tubes were placed. The sternum was closed with  #6 stainless steel wires. The fascia was closed with continuous # 1 vicryl suture. The subcutaneous tissue was closed with 2-0 vicryl continuous suture. The skin was closed with 3-0 vicryl subcuticular suture. All sponge, needle, and instrument counts were reported correct at the end of the case. Dry sterile dressings were placed over the incisions and around the chest tubes which were connected to pleurevac suction. The patient was then transported to the surgical intensive care unit in stable condition.

## 2023-07-08 NOTE — Transfer of Care (Signed)
Immediate Anesthesia Transfer of Care Note  Patient: Oneil Melaragno  Procedure(s) Performed: CORONARY ARTERY BYPASS GRAFTING (CABG) x2 USING LEFT INTERNAL MAMMARY ARTERY (LIMA) AND ENDOSCOPICALLY HARVESTED RIGHT GREATER SAPHENOUS VEIN (Chest) TRANSESOPHAGEAL ECHOCARDIOGRAM  Patient Location: ICU  Anesthesia Type:General  Level of Consciousness: sedated and Patient remains intubated per anesthesia plan  Airway & Oxygen Therapy: Patient remains intubated per anesthesia plan and Patient placed on Ventilator (see vital sign flow sheet for setting)  Post-op Assessment: Report given to RN and Post -op Vital signs reviewed and stable  Post vital signs: Reviewed and stable  Last Vitals:  Vitals Value Taken Time  BP 124/81 07/08/23 1222  Temp 35.4 C 07/08/23 1223  Pulse 80 07/08/23 1223  Resp 18 07/08/23 1223  SpO2 100 % 07/08/23 1223  Vitals shown include unfiled device data.  Last Pain:  Vitals:   07/08/23 0616  TempSrc:   PainSc: 0-No pain         Complications: No notable events documented.

## 2023-07-09 ENCOUNTER — Encounter (HOSPITAL_COMMUNITY): Payer: Self-pay | Admitting: Surgery

## 2023-07-09 ENCOUNTER — Inpatient Hospital Stay (HOSPITAL_COMMUNITY): Payer: Medicare Other

## 2023-07-09 LAB — BASIC METABOLIC PANEL
Anion gap: 7 (ref 5–15)
Anion gap: 8 (ref 5–15)
BUN: 11 mg/dL (ref 8–23)
BUN: 16 mg/dL (ref 8–23)
CO2: 23 mmol/L (ref 22–32)
CO2: 24 mmol/L (ref 22–32)
Calcium: 7.7 mg/dL — ABNORMAL LOW (ref 8.9–10.3)
Calcium: 8.3 mg/dL — ABNORMAL LOW (ref 8.9–10.3)
Chloride: 104 mmol/L (ref 98–111)
Chloride: 99 mmol/L (ref 98–111)
Creatinine, Ser: 0.59 mg/dL — ABNORMAL LOW (ref 0.61–1.24)
Creatinine, Ser: 0.94 mg/dL (ref 0.61–1.24)
GFR, Estimated: >60 mL/min (ref >60)
GFR, Estimated: >60 mL/min (ref >60)
Glucose, Bld: 132 mg/dL — ABNORMAL HIGH (ref 70–99)
Glucose, Bld: 132 mg/dL — ABNORMAL HIGH (ref 70–99)
Potassium: 3.8 mmol/L (ref 3.5–5.1)
Potassium: 3.8 mmol/L (ref 3.5–5.1)
Sodium: 134 mmol/L — ABNORMAL LOW (ref 135–145)
Sodium: 131 mmol/L — ABNORMAL LOW (ref 135–145)

## 2023-07-09 LAB — CBC
HCT: 26 % — ABNORMAL LOW (ref 39.0–52.0)
HCT: 27.2 % — ABNORMAL LOW (ref 39.0–52.0)
Hemoglobin: 8.9 g/dL — ABNORMAL LOW (ref 13.0–17.0)
Hemoglobin: 9 g/dL — ABNORMAL LOW (ref 13.0–17.0)
MCH: 30.9 pg (ref 26.0–34.0)
MCH: 30 pg (ref 26.0–34.0)
MCHC: 34.2 g/dL (ref 30.0–36.0)
MCHC: 33.1 g/dL (ref 30.0–36.0)
MCV: 90.3 fL (ref 80.0–100.0)
MCV: 90.7 fL (ref 80.0–100.0)
Platelets: 107 10*3/uL — ABNORMAL LOW (ref 150–400)
Platelets: 120 10*3/uL — ABNORMAL LOW (ref 150–400)
RBC: 2.88 MIL/uL — ABNORMAL LOW (ref 4.22–5.81)
RBC: 3 MIL/uL — ABNORMAL LOW (ref 4.22–5.81)
RDW: 13.2 % (ref 11.5–15.5)
RDW: 13.5 % (ref 11.5–15.5)
WBC: 10 10*3/uL (ref 4.0–10.5)
WBC: 12.2 10*3/uL — ABNORMAL HIGH (ref 4.0–10.5)
nRBC: 0 % (ref 0.0–0.2)
nRBC: 0 % (ref 0.0–0.2)

## 2023-07-09 LAB — ECHO INTRAOPERATIVE TEE
AR max vel: 2.79 cm2
AV Area VTI: 2.87 cm2
AV Area mean vel: 2.77 cm2
AV Mean grad: 3 mmHg
AV Peak grad: 5.3 mmHg
Ao pk vel: 1.15 m/s
Area-P 1/2: 3.93 cm2
Height: 68.5 in
MV VTI: 4.59 cm2
S' Lateral: 2.8 cm
Weight: 2720 [oz_av]

## 2023-07-09 LAB — GLUCOSE, CAPILLARY
Glucose-Capillary: 108 mg/dL — ABNORMAL HIGH (ref 70–99)
Glucose-Capillary: 113 mg/dL — ABNORMAL HIGH (ref 70–99)
Glucose-Capillary: 120 mg/dL — ABNORMAL HIGH (ref 70–99)
Glucose-Capillary: 120 mg/dL — ABNORMAL HIGH (ref 70–99)
Glucose-Capillary: 122 mg/dL — ABNORMAL HIGH (ref 70–99)
Glucose-Capillary: 123 mg/dL — ABNORMAL HIGH (ref 70–99)
Glucose-Capillary: 126 mg/dL — ABNORMAL HIGH (ref 70–99)
Glucose-Capillary: 131 mg/dL — ABNORMAL HIGH (ref 70–99)
Glucose-Capillary: 141 mg/dL — ABNORMAL HIGH (ref 70–99)
Glucose-Capillary: 158 mg/dL — ABNORMAL HIGH (ref 70–99)

## 2023-07-09 LAB — MAGNESIUM
Magnesium: 2.7 mg/dL — ABNORMAL HIGH (ref 1.7–2.4)
Magnesium: 2.6 mg/dL — ABNORMAL HIGH (ref 1.7–2.4)

## 2023-07-09 MED ORDER — INSULIN ASPART 100 UNIT/ML IJ SOLN: 0.0000 [IU] | INTRAMUSCULAR | Status: DC

## 2023-07-09 MED ORDER — POTASSIUM CHLORIDE 10 MEQ/50ML IV SOLN
10.0000 meq | INTRAVENOUS | Status: AC
  Administered 2023-07-09 (×3): 10 meq via INTRAVENOUS
  Filled 2023-07-09 (×3): qty 50

## 2023-07-09 MED ORDER — FUROSEMIDE 10 MG/ML IJ SOLN
40.0000 mg | Freq: Once | INTRAMUSCULAR | Status: AC
  Administered 2023-07-09: 40 mg via INTRAVENOUS
  Filled 2023-07-09: qty 4

## 2023-07-09 MED ORDER — INSULIN ASPART 100 UNIT/ML IJ SOLN
0.0000 [IU] | INTRAMUSCULAR | Status: DC
  Administered 2023-07-09 – 2023-07-10 (×5): 2 [IU] via SUBCUTANEOUS

## 2023-07-09 MED FILL — Heparin Sodium (Porcine) Inj 1000 Unit/ML: Qty: 1000 | Status: AC

## 2023-07-09 MED FILL — Magnesium Sulfate Inj 50%: INTRAMUSCULAR | Qty: 10 | Status: AC

## 2023-07-09 MED FILL — Thrombin (Recombinant) For Soln 20000 Unit: CUTANEOUS | Qty: 1 | Status: AC

## 2023-07-09 MED FILL — Potassium Chloride Inj 2 mEq/ML: INTRAVENOUS | Qty: 20 | Status: AC

## 2023-07-09 NOTE — Progress Notes (Signed)
1 Day Post-Op Procedure(s) (LRB): CORONARY ARTERY BYPASS GRAFTING (CABG) x2 USING LEFT INTERNAL MAMMARY ARTERY (LIMA) AND ENDOSCOPICALLY HARVESTED RIGHT GREATER SAPHENOUS VEIN (N/A) TRANSESOPHAGEAL ECHOCARDIOGRAM (N/A) Subjective: No complaints. Pain under good control. Stood up at side of bed.  Objective: Vital signs in last 24 hours: Temp:  [95.5 F (35.3 C)-98.6 F (37 C)] 98.4 F (36.9 C) (08/16 0615) Pulse Rate:  [60-96] 67 (08/16 0615) Cardiac Rhythm: Normal sinus rhythm (08/16 0600) Resp:  [0-27] 18 (08/16 0615) BP: (91-126)/(52-86) 113/60 (08/16 0600) SpO2:  [93 %-100 %] 96 % (08/16 0615) Arterial Line BP: (85-162)/(41-73) 140/46 (08/16 0615) FiO2 (%):  [40 %-50 %] 40 % (08/15 1518) Weight:  [80 kg] 80 kg (08/16 0500)  Hemodynamic parameters for last 24 hours: PAP: (13-33)/(-1-18) 14/0 CO:  [3.9 L/min-7.7 L/min] 7.7 L/min CI:  [2 L/min/m2-4.05 L/min/m2] 4.05 L/min/m2  Intake/Output from previous day: 08/15 0701 - 08/16 0700 In: 7519.2 [I.V.:3940.2; Blood:675; IV Piggyback:2904] Out: 3972 [Urine:2145; Blood:1227; Chest Tube:600] Intake/Output this shift: Total I/O In: 948 [I.V.:573.9; IV Piggyback:374.1] Out: 955 [Urine:675; Chest Tube:280]  General appearance: alert and cooperative Neurologic: intact Heart: regular rate and rhythm, S1, S2 normal, no murmur Lungs: clear to auscultation bilaterally Extremities: edema minimal Wound: dressings dry  Lab Results: Recent Labs    07/08/23 1758 07/09/23 0256  WBC 11.8* 10.0  HGB 9.3* 8.9*  HCT 27.4* 26.0*  PLT 116* 107*   BMET:  Recent Labs    07/08/23 1758 07/09/23 0256  NA 136 134*  K 3.8 3.8  CL 106 104  CO2 22 23  GLUCOSE 118* 132*  BUN 11 11  CREATININE 0.68 0.59*  CALCIUM 7.6* 7.7*    PT/INR:  Recent Labs    07/08/23 1234  LABPROT 18.1*  INR 1.5*   ABG    Component Value Date/Time   PHART 7.343 (L) 07/08/2023 1702   HCO3 24.7 07/08/2023 1702   TCO2 26 07/08/2023 1702   ACIDBASEDEF  1.0 07/08/2023 1702   O2SAT 99 07/08/2023 1702   CBG (last 3)  Recent Labs    07/09/23 0207 07/09/23 0301 07/09/23 0507  GLUCAP 126* 123* 120*   CXR: clear  ECG: non-specific T-wave changes.  Assessment/Plan: S/P Procedure(s) (LRB): CORONARY ARTERY BYPASS GRAFTING (CABG) x2 USING LEFT INTERNAL MAMMARY ARTERY (LIMA) AND ENDOSCOPICALLY HARVESTED RIGHT GREATER SAPHENOUS VEIN (N/A) TRANSESOPHAGEAL ECHOCARDIOGRAM (N/A)  POD 1 Hemodynamically stable in sinus rhythm 60's. Will hold off on Lopressor for now.  DC chest tubes, swan, arterial line.  Volume excess: wt is 6 lbs over preop. Start diuresis and KCL.  IS, OOB, mobilize.  Glucose under good control. No hx of DM and preop Hgb A1c was 5.6. Will transition to SSI today and probably stop tomorrow.   LOS: 1 day    [name] 07/09/2023

## 2023-07-09 NOTE — Anesthesia Postprocedure Evaluation (Signed)
Anesthesia Post Note  Patient: Dustin Mcclure  Procedure(s) Performed: CORONARY ARTERY BYPASS GRAFTING (CABG) x2 USING LEFT INTERNAL MAMMARY ARTERY (LIMA) AND ENDOSCOPICALLY HARVESTED RIGHT GREATER SAPHENOUS VEIN (Chest) TRANSESOPHAGEAL ECHOCARDIOGRAM     Patient location during evaluation: SICU Anesthesia Type: General Level of consciousness: sedated Pain management: pain level controlled Vital Signs Assessment: post-procedure vital signs reviewed and stable Respiratory status: patient remains intubated per anesthesia plan Cardiovascular status: stable Postop Assessment: no apparent nausea or vomiting Anesthetic complications: no   No notable events documented.  Last Vitals:  Vitals:   07/09/23 1200 07/09/23 1622  BP: (!) 105/58   Pulse: 60   Resp: 18   Temp:  36.9 C  SpO2: 100%     Last Pain:  Vitals:   07/09/23 1622  TempSrc: Oral  PainSc:                  Nelle Don Runell Kovich

## 2023-07-09 NOTE — Progress Notes (Signed)
Report received from [facility]. Patient is up in recliner, denies pain. Family at bedside. Assessment complete. VSS. Reinforced sternal precautions. Verbalizes understanding.

## 2023-07-10 ENCOUNTER — Inpatient Hospital Stay (HOSPITAL_COMMUNITY): Payer: Medicare Other

## 2023-07-10 LAB — CBC
HCT: 24.1 % — ABNORMAL LOW (ref 39.0–52.0)
Hemoglobin: 8.2 g/dL — ABNORMAL LOW (ref 13.0–17.0)
MCH: 31.3 pg (ref 26.0–34.0)
MCHC: 34 g/dL (ref 30.0–36.0)
MCV: 92 fL (ref 80.0–100.0)
Platelets: 90 10*3/uL — ABNORMAL LOW (ref 150–400)
RBC: 2.62 MIL/uL — ABNORMAL LOW (ref 4.22–5.81)
RDW: 13.6 % (ref 11.5–15.5)
WBC: 8.6 10*3/uL (ref 4.0–10.5)
nRBC: 0 % (ref 0.0–0.2)

## 2023-07-10 LAB — BASIC METABOLIC PANEL
Anion gap: 8 (ref 5–15)
BUN: 14 mg/dL (ref 8–23)
CO2: 26 mmol/L (ref 22–32)
Calcium: 8.1 mg/dL — ABNORMAL LOW (ref 8.9–10.3)
Chloride: 99 mmol/L (ref 98–111)
Creatinine, Ser: 0.64 mg/dL (ref 0.61–1.24)
GFR, Estimated: >60 mL/min (ref >60)
Glucose, Bld: 102 mg/dL — ABNORMAL HIGH (ref 70–99)
Potassium: 3.5 mmol/L (ref 3.5–5.1)
Sodium: 133 mmol/L — ABNORMAL LOW (ref 135–145)

## 2023-07-10 LAB — GLUCOSE, CAPILLARY
Glucose-Capillary: 98 mg/dL (ref 70–99)
Glucose-Capillary: 131 mg/dL — ABNORMAL HIGH (ref 70–99)
Glucose-Capillary: 86 mg/dL (ref 70–99)
Glucose-Capillary: 119 mg/dL — ABNORMAL HIGH (ref 70–99)
Glucose-Capillary: 102 mg/dL — ABNORMAL HIGH (ref 70–99)
Glucose-Capillary: 92 mg/dL (ref 70–99)

## 2023-07-10 MED ORDER — FUROSEMIDE 40 MG PO TABS
40.0000 mg | ORAL_TABLET | Freq: Every day | ORAL | Status: DC
  Administered 2023-07-10: 40 mg via ORAL
  Filled 2023-07-10 (×2): qty 1

## 2023-07-10 MED ORDER — ~~LOC~~ CARDIAC SURGERY, PATIENT & FAMILY EDUCATION: Freq: Once | Status: DC

## 2023-07-10 MED ORDER — SODIUM CHLORIDE 0.9 % IV SOLN: 250.0000 mL | INTRAVENOUS | Status: DC | PRN

## 2023-07-10 MED ORDER — POTASSIUM CHLORIDE CRYS ER 20 MEQ PO TBCR
20.0000 meq | EXTENDED_RELEASE_TABLET | ORAL | Status: AC
  Administered 2023-07-10 (×3): 20 meq via ORAL
  Filled 2023-07-10 (×3): qty 1

## 2023-07-10 MED ORDER — SODIUM CHLORIDE 0.9% FLUSH
3.0000 mL | Freq: Two times a day (BID) | INTRAVENOUS | Status: DC
  Administered 2023-07-10: 3 mL via INTRAVENOUS

## 2023-07-10 MED ORDER — SODIUM CHLORIDE 0.9% FLUSH: 3.0000 mL | INTRAVENOUS | Status: DC | PRN

## 2023-07-10 NOTE — Plan of Care (Signed)
  Problem: Education: Goal: Understanding of CV disease, CV risk reduction, and recovery process will improve Outcome: Progressing Goal: Individualized Educational Video(s) Outcome: Progressing   Problem: Health Behavior/Discharge Planning: Goal: Ability to safely manage health-related needs after discharge will improve Outcome: Progressing   Problem: Education: Goal: Knowledge of General Education information will improve Description: Including pain rating scale, medication(s)/side effects and non-pharmacologic comfort measures Outcome: Progressing   Problem: Clinical Measurements: Goal: Ability to maintain clinical measurements within normal limits will improve Outcome: Progressing   Problem: Activity: Goal: Risk for activity intolerance will decrease Outcome: Progressing   Problem: Nutrition: Goal: Adequate nutrition will be maintained Outcome: Progressing   Problem: Coping: Goal: Level of anxiety will decrease Outcome: Progressing   Problem: Skin Integrity: Goal: Risk for impaired skin integrity will decrease Outcome: Progressing

## 2023-07-10 NOTE — TOC Initial Note (Signed)
Transition of Care [facility]) - Initial/Assessment Note    Patient Details  Name: Dustin Mcclure MRN: [id number] Date of Birth: 04/26/1941  [facility]) CM/SW Contact:    Dustin Cousin, RN Phone Number: [phone] 07/10/2023, 10:22 AM  Clinical Narrative:    CM spoke to pt and wife. States he is independent pta. Will continue to follow for dc needs.                Expected Discharge Plan: Home/Self Care Barriers to Discharge: Continued Medical Work up   Patient Goals and CMS Choice            Expected Discharge Plan and Services   Discharge Planning Services: CM Consult                                          Prior Living Arrangements/Services     Patient language and need for interpreter reviewed:: Yes Do you feel safe going back to the place where you live?: Yes      Need for Family Participation in Patient Care: No (Comment) Care giver support system in place?: Yes (comment)   Criminal Activity/Legal Involvement Pertinent to Current Situation/Hospitalization: No - Comment as needed  Activities of Daily Living      Permission Sought/Granted Permission sought to share information with : Case Manager Permission granted to share information with : Yes, Verbal Permission Granted  Share Information with NAME: Dustin Mcclure     Permission granted to share info w Relationship: wife  Permission granted to share info w Contact Information: [phone]  Emotional Assessment Appearance:: Appears stated age Attitude/Demeanor/Rapport: Engaged Affect (typically observed): Accepting Orientation: : Oriented to Self, Oriented to Place, Oriented to  Time, Oriented to Situation   Psych Involvement: No (comment)  Admission diagnosis:  Coronary artery disease [I25.10] Patient Active Problem List   Diagnosis Date Noted   Coronary artery disease 07/08/2023   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   Palpitations 06/23/2023   Elevated troponin 06/19/2023    Essential hypertension 06/19/2023   Mixed hyperlipidemia 06/19/2023   Coronary artery disease involving native coronary artery of native heart 06/19/2023   GERD without esophagitis 06/19/2023   Bilateral inguinal hernia (BIH) 06/21/2018   PCP:  Dustin Floro, MD Pharmacy:   [id number] [place], [facility] - [address] [place] [address] Phone: [phone] Fax: [phone]     Social Determinants of Health (SDOH) Social History: SDOH Screenings   Food Insecurity: No Food Insecurity (06/20/2023)  Housing: Low Risk  (06/20/2023)  Transportation Needs: No Transportation Needs (06/20/2023)  Utilities: Not At Risk (06/20/2023)  Tobacco Use: Medium Risk (07/08/2023)   SDOH Interventions:     Readmission Risk Interventions     No data to display

## 2023-07-10 NOTE — Progress Notes (Signed)
      [address]       [place] [address]             [phone]                 2 Days Post-Op Procedure(s) (LRB): CORONARY ARTERY BYPASS GRAFTING (CABG) x2 USING LEFT INTERNAL MAMMARY ARTERY (LIMA) AND ENDOSCOPICALLY HARVESTED RIGHT GREATER SAPHENOUS VEIN (N/A) TRANSESOPHAGEAL ECHOCARDIOGRAM (N/A)   Events: No events _______________________________________________________________ Vitals: BP 114/84   Pulse 70   Temp 99.3 F (37.4 C)   Resp 19   Ht 5' 8.5" (1.74 m)   Wt 79.9 kg   SpO2 91%   BMI 26.39 kg/m  Filed Weights   07/08/23 0545 07/09/23 0500 07/10/23 0500  Weight: 77.1 kg 80 kg 79.9 kg     - Neuro: alert NAD  - Cardiovascular: sinus  Drips: none.      - Pulm: EWOB    ABG    Component Value Date/Time   PHART 7.343 (L) 07/08/2023 1702   PCO2ART 45.3 07/08/2023 1702   PO2ART 129 (H) 07/08/2023 1702   HCO3 24.7 07/08/2023 1702   TCO2 26 07/08/2023 1702   ACIDBASEDEF 1.0 07/08/2023 1702   O2SAT 99 07/08/2023 1702    - Abd: ND - Extremity: warm  .Intake/Output      08/16 0701 08/17 0700 08/17 0701 08/18 0700   P.O. 480    I.V. (mL/kg) 242.6 (3)    Blood     IV Piggyback 413.8    Total Intake(mL/kg) 1136.4 (14.2)    Urine (mL/kg/hr) 1530 (0.8)    Blood     Chest Tube 50    Total Output 1580    Net -443.6            _______________________________________________________________ Labs:    Latest Ref Rng & Units 07/10/2023    3:16 AM 07/09/2023    6:29 PM 07/09/2023    2:56 AM  CBC  WBC 4.0 - 10.5 K/uL 8.6  12.2  10.0   Hemoglobin 13.0 - 17.0 g/dL 8.2  9.0  8.9   Hematocrit 39.0 - 52.0 % 24.1  27.2  26.0   Platelets 150 - 400 K/uL 90  120  107       Latest Ref Rng & Units 07/10/2023    3:16 AM 07/09/2023    6:29 PM 07/09/2023    2:56 AM  CMP  Glucose 70 - 99 mg/dL 401  027  253   BUN 8 - 23 mg/dL 14  16  11    Creatinine 0.61 - 1.24 mg/dL 6.64  4.03  4.74   Sodium 135 - 145 mmol/L 133  131  134   Potassium  3.5 - 5.1 mmol/L 3.5  3.8  3.8   Chloride 98 - 111 mmol/L 99  99  104   CO2 22 - 32 mmol/L 26  24  23    Calcium 8.9 - 10.3 mg/dL 8.1  8.3  7.7     CXR: clear  _______________________________________________________________  Assessment and Plan: POD 2 s/p CABG  Neuro: pain controlled  CV: on A/S.  BB held.  HR in 70s.  BP stable Pulm: IS, ambulation Renal: lasix today GI: on diet Heme: stable ID: afebrile Endo: SSI Dispo: floor   Corliss Skains 07/10/2023 8:53 AM

## 2023-07-11 LAB — CBC
HCT: 25.2 % — ABNORMAL LOW (ref 39.0–52.0)
Hemoglobin: 8.4 g/dL — ABNORMAL LOW (ref 13.0–17.0)
MCH: 30.4 pg (ref 26.0–34.0)
MCHC: 33.3 g/dL (ref 30.0–36.0)
MCV: 91.3 fL (ref 80.0–100.0)
Platelets: 101 10*3/uL — ABNORMAL LOW (ref 150–400)
RBC: 2.76 MIL/uL — ABNORMAL LOW (ref 4.22–5.81)
RDW: 13.6 % (ref 11.5–15.5)
WBC: 7.4 10*3/uL (ref 4.0–10.5)
nRBC: 0 % (ref 0.0–0.2)

## 2023-07-11 LAB — BASIC METABOLIC PANEL
Anion gap: 10 (ref 5–15)
BUN: 14 mg/dL (ref 8–23)
CO2: 25 mmol/L (ref 22–32)
Calcium: 8.4 mg/dL — ABNORMAL LOW (ref 8.9–10.3)
Chloride: 101 mmol/L (ref 98–111)
Creatinine, Ser: 0.84 mg/dL (ref 0.61–1.24)
GFR, Estimated: >60 mL/min (ref >60)
Glucose, Bld: 101 mg/dL — ABNORMAL HIGH (ref 70–99)
Potassium: 3.8 mmol/L (ref 3.5–5.1)
Sodium: 136 mmol/L (ref 135–145)

## 2023-07-11 LAB — GLUCOSE, CAPILLARY: Glucose-Capillary: 100 mg/dL — ABNORMAL HIGH (ref 70–99)

## 2023-07-11 LAB — MAGNESIUM: Magnesium: 2.1 mg/dL (ref 1.7–2.4)

## 2023-07-11 MED ORDER — METOPROLOL TARTRATE 12.5 MG HALF TABLET
12.5000 mg | ORAL_TABLET | Freq: Two times a day (BID) | ORAL | Status: DC
  Administered 2023-07-11 – 2023-07-12 (×3): 12.5 mg via ORAL
  Filled 2023-07-11 (×3): qty 1

## 2023-07-11 MED ORDER — FUROSEMIDE 10 MG/ML IJ SOLN
40.0000 mg | Freq: Once | INTRAMUSCULAR | Status: AC
  Administered 2023-07-11: 40 mg via INTRAVENOUS
  Filled 2023-07-11: qty 4

## 2023-07-11 MED ORDER — POTASSIUM CHLORIDE CRYS ER 20 MEQ PO TBCR
20.0000 meq | EXTENDED_RELEASE_TABLET | Freq: Every day | ORAL | Status: DC
  Administered 2023-07-11: 20 meq via ORAL
  Filled 2023-07-11: qty 1

## 2023-07-11 NOTE — Progress Notes (Signed)
1545 Atrial epicardial pacing wires removed with 2 RN's at bedside after cleansing sites with chlorhexidine 2%. .  No ectopy.  No chest pain.  Ventricular epicardial pacing wires removed.  2 PVC's noted.  No other ectopy noted.  No chest pain.  Gauze dressing applied.  Vitals per policy.

## 2023-07-11 NOTE — Progress Notes (Addendum)
      [address]       [place] [address]             [phone]      3 Days Post-Op Procedure(s) (LRB): CORONARY ARTERY BYPASS GRAFTING (CABG) x2 USING LEFT INTERNAL MAMMARY ARTERY (LIMA) AND ENDOSCOPICALLY HARVESTED RIGHT GREATER SAPHENOUS VEIN (N/A) TRANSESOPHAGEAL ECHOCARDIOGRAM (N/A)  Subjective:  Patient sitting up in chair.  Doing well overall.  He questions his HR stating that is has been high at times.  + ambulation    Objective: Vital signs in last 24 hours: Temp:  [97.7 F (36.5 C)-99.7 F (37.6 C)] 98.1 F (36.7 C) (08/18 0746) Pulse Rate:  [70-101] 88 (08/18 0800) Cardiac Rhythm: Normal sinus rhythm (08/17 2000) Resp:  [13-27] 24 (08/18 0800) BP: (98-144)/(61-86) 113/66 (08/18 0800) SpO2:  [89 %-99 %] 96 % (08/18 0800) Weight:  [78.3 kg] 78.3 kg (08/18 0500)  Intake/Output from previous day: 08/17 0701 - 08/18 0700 In: 47.3 [I.V.:47.3] Out: 2700 [Urine:2700]  General appearance: alert, cooperative, and no distress Heart: regular rate and rhythm Lungs: clear to auscultation bilaterally Abdomen: soft, non-tender; bowel sounds normal; no masses,  no organomegaly Extremities: edema + pitting  Wound: clean and dry  Lab Results: Recent Labs    07/10/23 0316 07/11/23 0237  WBC 8.6 7.4  HGB 8.2* 8.4*  HCT 24.1* 25.2*  PLT 90* 101*   BMET:  Recent Labs    07/10/23 0316 07/11/23 0237  NA 133* 136  K 3.5 3.8  CL 99 101  CO2 26 25  GLUCOSE 102* 101*  BUN 14 14  CREATININE 0.64 0.84  CALCIUM 8.1* 8.4*    PT/INR:  Recent Labs    07/08/23 1234  LABPROT 18.1*  INR 1.5*   ABG    Component Value Date/Time   PHART 7.343 (L) 07/08/2023 1702   HCO3 24.7 07/08/2023 1702   TCO2 26 07/08/2023 1702   ACIDBASEDEF 1.0 07/08/2023 1702   O2SAT 99 07/08/2023 1702   CBG (last 3)  Recent Labs    07/10/23 1950 07/10/23 2326 07/11/23 0312  GLUCAP 102* 92 100*    Assessment/Plan: S/P Procedure(s) (LRB): CORONARY ARTERY BYPASS  GRAFTING (CABG) x2 USING LEFT INTERNAL MAMMARY ARTERY (LIMA) AND ENDOSCOPICALLY HARVESTED RIGHT GREATER SAPHENOUS VEIN (N/A) TRANSESOPHAGEAL ECHOCARDIOGRAM (N/A)  CV- NSR with occasional PVC, BP stable- patient was on Toprol XL 50 mg daily at home, will resume Lopressor 12.5 mg BID as he tolerates Pulm- no acute issues, continue IS Renal- creatinine, lytes are stable, pitting edema on exam, will give IV lasix today, supplement potassium , TED hose Expected post operative blood loss anemia, Hgb stable CBGs controlled, patient is not a diabetic will d/c SSIP Dispo- patient stable, restart home Lopressor at reduced dose as hemodynamics allow, d/c EPW today.Marland Kitchen awaiting bed on 4E.Marland Kitchen likely for d/c on Tuesday pending HR and volume status.. patient does not have DME needs   LOS: 3 days    Lowella Dandy, PA-C 07/11/2023  Agree with above Doing well Awaiting floor bed Starting BB  Abishai Viegas O Alem Fahl

## 2023-07-11 NOTE — Plan of Care (Signed)
  Problem: Education: Goal: Understanding of CV disease, CV risk reduction, and recovery process will improve Outcome: Progressing   Problem: Activity: Goal: Ability to return to baseline activity level will improve Outcome: Progressing   Problem: Cardiovascular: Goal: Ability to achieve and maintain adequate cardiovascular perfusion will improve Outcome: Progressing Goal: Vascular access site(s) Level 0-1 will be maintained Outcome: Progressing   Problem: Health Behavior/Discharge Planning: Goal: Ability to safely manage health-related needs after discharge will improve Outcome: Progressing   Problem: Respiratory: Goal: Respiratory status will improve Outcome: Progressing   Problem: Skin Integrity: Goal: Risk for impaired skin integrity will decrease Outcome: Progressing   Problem: Urinary Elimination: Goal: Ability to achieve and maintain adequate renal perfusion and functioning will improve Outcome: Progressing

## 2023-07-12 LAB — BASIC METABOLIC PANEL
Anion gap: 9 (ref 5–15)
BUN: 14 mg/dL (ref 8–23)
CO2: 27 mmol/L (ref 22–32)
Calcium: 8.1 mg/dL — ABNORMAL LOW (ref 8.9–10.3)
Chloride: 98 mmol/L (ref 98–111)
Creatinine, Ser: 0.74 mg/dL (ref 0.61–1.24)
GFR, Estimated: >60 mL/min (ref >60)
Glucose, Bld: 101 mg/dL — ABNORMAL HIGH (ref 70–99)
Potassium: 3.9 mmol/L (ref 3.5–5.1)
Sodium: 134 mmol/L — ABNORMAL LOW (ref 135–145)

## 2023-07-12 MED ORDER — POTASSIUM CHLORIDE CRYS ER 20 MEQ PO TBCR: 20.0000 meq | EXTENDED_RELEASE_TABLET | Freq: Every day | ORAL | Status: DC

## 2023-07-12 MED ORDER — FUROSEMIDE 40 MG PO TABS: 40.0000 mg | ORAL_TABLET | Freq: Every day | ORAL | 0 refills | Status: DC

## 2023-07-12 MED ORDER — POTASSIUM CHLORIDE CRYS ER 20 MEQ PO TBCR: 20.0000 meq | EXTENDED_RELEASE_TABLET | Freq: Every day | ORAL | 0 refills | Status: DC

## 2023-07-12 MED ORDER — METOPROLOL SUCCINATE ER 25 MG PO TB24: 25.0000 mg | ORAL_TABLET | Freq: Every morning | ORAL | 1 refills | Status: DC

## 2023-07-12 MED ORDER — ASPIRIN 325 MG PO TBEC: 325.0000 mg | DELAYED_RELEASE_TABLET | Freq: Every day | ORAL | Status: DC

## 2023-07-12 MED ORDER — POTASSIUM CHLORIDE CRYS ER 20 MEQ PO TBCR
20.0000 meq | EXTENDED_RELEASE_TABLET | Freq: Two times a day (BID) | ORAL | Status: DC
  Administered 2023-07-12: 20 meq via ORAL
  Filled 2023-07-12: qty 1

## 2023-07-12 MED ORDER — TRAMADOL HCL 50 MG PO TABS: 50.0000 mg | ORAL_TABLET | Freq: Four times a day (QID) | ORAL | 0 refills | Status: DC | PRN

## 2023-07-12 MED ORDER — FUROSEMIDE 40 MG PO TABS
40.0000 mg | ORAL_TABLET | Freq: Every day | ORAL | Status: DC
  Administered 2023-07-12: 40 mg via ORAL
  Filled 2023-07-12: qty 1

## 2023-07-12 NOTE — Progress Notes (Signed)
Pt has walked this morning.  Pt received OHS book and education on restrictions, heart healthy diet, ex guidelines, Move in the Tube sheet, incentive spirometer use when d/c and CRPII. Pt denies questions and was encouraged to look in the book for additional information. Referral placed to [facility].   Pt reports doing CR over 30 years ago, has some interest in doing program again.   Referral placed to: [facility] under cardiologist Dr. Epifanio Lesches Qualifying Dx: 8/15 CABGx2  Dustin Mcclure E Dustin Mcclure 07/12/2023 9:04 AM  [phone]

## 2023-07-12 NOTE — Progress Notes (Addendum)
TCTS DAILY ICU PROGRESS NOTE                   [address]            [place] [address]          [phone]   4 Days Post-Op Procedure(s) (LRB): CORONARY ARTERY BYPASS GRAFTING (CABG) x2 USING LEFT INTERNAL MAMMARY ARTERY (LIMA) AND ENDOSCOPICALLY HARVESTED RIGHT GREATER SAPHENOUS VEIN (N/A) TRANSESOPHAGEAL ECHOCARDIOGRAM (N/A)  Total Length of Stay:  LOS: 4 days   Subjective: Patient sitting in chair and eating breakfast. He is wondering if he can go home today. He had multiple bowel movements yesterday. He ambulated in hallway several times  Objective: Vital signs in last 24 hours: Temp:  [98 F (36.7 C)-98.4 F (36.9 C)] 98.1 F (36.7 C) (08/18 2300) Pulse Rate:  [70-91] 91 (08/19 0600) Cardiac Rhythm: Normal sinus rhythm (08/18 1920) Resp:  [15-25] 19 (08/19 0600) BP: (98-133)/(60-81) 116/80 (08/19 0600) SpO2:  [89 %-99 %] 95 % (08/19 0600) Weight:  [77.4 kg] 77.4 kg (08/19 0600)  Filed Weights   07/10/23 0500 07/11/23 0500 07/12/23 0600  Weight: 79.9 kg 78.3 kg 77.4 kg    Weight change: -0.9 kg   Hemodynamic parameters for last 24 hours:    Intake/Output from previous day: 08/18 0701 - 08/19 0700 In: -  Out: 2120 [Urine:2120]  Intake/Output this shift: Total I/O In: -  Out: 900 [Urine:900]  Current Meds: Scheduled Meds:  acetaminophen  1,000 mg Oral Q6H   Or   acetaminophen (TYLENOL) oral liquid 160 mg/5 mL  1,000 mg Per Tube Q6H   aspirin EC  325 mg Oral Daily   Or   aspirin  324 mg Per Tube Daily   atorvastatin  40 mg Oral Daily   bisacodyl  10 mg Oral Daily   Or   bisacodyl  10 mg Rectal Daily   Chlorhexidine Gluconate Cloth  6 each Topical Daily   [facility], Patient & Family Education   Does not apply Once   docusate sodium  200 mg Oral Daily   ezetimibe  10 mg Oral Daily   finasteride  5 mg Oral Q1500   metoprolol tartrate  12.5 mg Oral BID   pantoprazole  40 mg Oral Daily   potassium chloride  20 mEq  Oral Daily   Continuous Infusions:  lactated ringers     lactated ringers Stopped (07/10/23 1159)   PRN Meds:.ondansetron (ZOFRAN) IV, oxyCODONE, traMADol  General appearance: alert, cooperative, and no distress Heart: RRR Lungs: clear to auscultation bilaterally Abdomen: Soft, non tender, bowel sounds present Extremities: Compression stockings in place, + edema Wounds: Sternal and RLE wounds are clean and dry. Small right lowe extremity wound (where greater saphenous vein was disconnected) with minor sero sanguinous drainage  Lab Results: CBC: Recent Labs    07/10/23 0316 07/11/23 0237  WBC 8.6 7.4  HGB 8.2* 8.4*  HCT 24.1* 25.2*  PLT 90* 101*   BMET:  Recent Labs    07/11/23 0237 07/12/23 0013  NA 136 134*  K 3.8 3.9  CL 101 98  CO2 25 27  GLUCOSE 101* 101*  BUN 14 14  CREATININE 0.84 0.74  CALCIUM 8.4* 8.1*    CMET: Lab Results  Component Value Date   WBC 7.4 07/11/2023   HGB 8.4 (L) 07/11/2023   HCT 25.2 (L) 07/11/2023   PLT 101 (L) 07/11/2023   GLUCOSE 101 (H) 07/12/2023   CHOL  122 06/20/2023   TRIG 41 06/20/2023   HDL 58 06/20/2023   LDLCALC 56 06/20/2023   ALT 20 07/06/2023   AST 21 07/06/2023   NA 134 (L) 07/12/2023   K 3.9 07/12/2023   CL 98 07/12/2023   CREATININE 0.74 07/12/2023   BUN 14 07/12/2023   CO2 27 07/12/2023   TSH 4.651 (H) 06/22/2023   INR 1.5 (H) 07/08/2023   HGBA1C 5.6 07/06/2023      PT/INR: No results for input(s): "LABPROT", "INR" in the last 72 hours. Radiology: No results found.   Assessment/Plan: S/P Procedure(s) (LRB): CORONARY ARTERY BYPASS GRAFTING (CABG) x2 USING LEFT INTERNAL MAMMARY ARTERY (LIMA) AND ENDOSCOPICALLY HARVESTED RIGHT GREATER SAPHENOUS VEIN (N/A) TRANSESOPHAGEAL ECHOCARDIOGRAM (N/A) CV-SR. On Lopressor 12.5 mg bid.He was on Toprol XL 50 mg daily for many years but this was recently decreased to Toprol XL 25 mg daily. Pulmonary-On room air. Encourage incentive spirometer Volume overload-Will  give PO Lasix today Expected post op blood loss anemia-Last H and H stable at 8.4 and 25.2 CBGs 102/92/100. Pre op HGA1C 5.6. 6. Supplement potassium 7. Thrombocytopenia-platelets this am up to 101,000 8. As discussed with Dr. Laneta Simmers, discharge home  Berneda Piccininni Margaretann Loveless PA-C 07/12/2023 6:59 AM

## 2023-07-15 ENCOUNTER — Encounter: Payer: Self-pay | Admitting: Surgery

## 2023-07-16 MED FILL — Mannitol IV Soln 20%: INTRAVENOUS | Qty: 500 | Status: AC

## 2023-07-16 MED FILL — Lidocaine HCl Local Soln Prefilled Syringe 100 MG/5ML (2%): INTRAMUSCULAR | Qty: 5 | Status: AC

## 2023-07-16 MED FILL — Electrolyte-R (PH 7.4) Solution: INTRAVENOUS | Qty: 3000 | Status: AC

## 2023-07-16 MED FILL — Sodium Chloride IV Soln 0.9%: INTRAVENOUS | Qty: 2000 | Status: AC

## 2023-07-16 MED FILL — Sodium Bicarbonate IV Soln 8.4%: INTRAVENOUS | Qty: 50 | Status: AC

## 2023-07-16 MED FILL — Heparin Sodium (Porcine) Inj 1000 Unit/ML: INTRAMUSCULAR | Qty: 20 | Status: AC

## 2023-07-19 ENCOUNTER — Encounter: Payer: Self-pay | Admitting: Cardiology

## 2023-07-20 ENCOUNTER — Telehealth (HOSPITAL_COMMUNITY): Payer: Self-pay

## 2023-07-20 NOTE — Telephone Encounter (Signed)
Pt insurance is active and benefits verified through [facility] Medicare. Co-pay $0.00, DED $0.00/$0.00 met, out of pocket $3,600.00/$936.56 met, co-insurance 0%. No pre-authorization required. Passport, 07/20/23 @ 2:09PM, REF#[id number]   How many CR sessions are covered? (36 visits for TCR, 72 visits for ICR)72 Is this a lifetime maximum or an annual maximum? Lifetime Has the member used any of these services to date? No Is there a time limit (weeks/months) on start of program and/or program completion? No     Will contact patient to see if he is interested in the Cardiac Rehab Program. If interested, patient will need to complete follow up appt. Once completed, patient will be contacted for scheduling upon review by the RN Navigator.

## 2023-07-20 NOTE — Telephone Encounter (Signed)
Attempted to call patient in regards to Cardiac Rehab - LM on VM 

## 2023-07-22 DIAGNOSIS — Z4802 Encounter for removal of sutures: Secondary | ICD-10-CM

## 2023-07-27 NOTE — Progress Notes (Signed)
Cardiology Office Note   Date:  07/28/2023  ID:  Dustin Mcclure, DOB January 19, 1941, MRN [id number] PCP:  Daisy Floro, MD [facility] Cardiologist: Little Ishikawa, MD  Reason for visit: Follow-up post CABG  History of Present Illness    Dustin Mcclure is a 82 y.o. male with a hx of CAD status post LAD angioplasty in 1990, hypertension, hyperlipidemia.    He was last seen in the office on May 25, 2023 mentioning he walked 5 days a week on the treadmill for 15 minutes and weightlifting with no complaints of chest pain.  He was admitted to [facility] long 7/27 for 3 hours of palpitations.  Troponins returned elevated.  Coronary CTA abnormal.  He was transferred to [facility] for [facility] which showed multivessel disease.  He returned for elective CABGx2 on August 15.  He remained in normal sinus rhythm, overall uncomplicated course.  Amlodipine discontinued.  Today, patient says he has done extraordinarily well post CABG.  He denies any discomfort.  No fever or shortness of breath.  He took Lasix and potassium supplement for 5 days.  His lower extremity edema continues to improve every day, right leg worse than left.  He denies PND orthopnea and lightheadedness.  He states he has whitecoat hypertension.  He checks his blood pressure at home and it is always less than 120/65.  When I asked about cardiac rehab, he asks if he can do exercises at home as he has the equipment.  I do not know the phone number popped   Objective / Physical Exam   Vital signs:  BP (!) 150/76 (BP Location: Right Arm, Patient Position: Sitting, Cuff Size: Normal)   Pulse 73   Ht 5\' 8"  (1.727 m)   Wt 171 lb 6.4 oz (77.7 kg)   SpO2 96%   BMI 26.06 kg/m     GEN: No acute distress NECK: No carotid bruits CARDIAC: RRR, no murmurs; chest incision well-healed with no erythema or drainage. RESPIRATORY:  Clear to auscultation without rales, wheezing or rhonchi  EXTREMITIES: Trace edema in left leg, 1+ RLE below shin,  venous graft site well healed   Assessment and Plan   Coronary artery disease, no angina -s/p LAD angioplasty 1990 -[facility] 05/2023: CTO of RCA, collateralized by left circumflex system, high-grade proximal left circumflex lesion, and FFR angiography positive LAD disease -s/p CABGx2 by Dr. Laneta Simmers on 07/08/23: LIMA-LAD and SVG-OM -Has follow-up scheduled Dr. Laneta Simmers in 3 weeks. -Continue aspirin 325 mg for now -will defer to Dr. Laneta Simmers on when to decrease to 81 mg daily.   -Continue lipid therapy.   -Continue Toprol XL 25 mg daily. -I do recommend cardiac rehab once cleared by Dr. Laneta Simmers. -Does not need further diuretics.  Hypertension, well-controlled -Patient with known whitecoat hypertension.  He checks his blood pressure at home with Omron cuff with blood pressures less than 120/65. -Goal BP is <130/80.  Recommend DASH diet (high in vegetables, fruits, low-fat dairy products, whole grains, poultry, fish, and nuts and low in sweets, sugar-sweetened beverages, and red meats), salt restriction and increase physical activity.  Hyperlipidemia with goal LDL less than 70 -LDL 56 in July 2024.  Continue Lipitor 40 mg daily and Zetia 10 mg daily. -Recommend cholesterol lowering diets - Mediterranean diet, DASH diet, vegetarian diet, low-carbohydrate diet and avoidance of trans fats.  Discussed healthier choice substitutes.  Nuts, high-fiber foods, and fiber supplements may also improve lipids.    Disposition - Follow-up in 4 months to Dr. Bjorn Pippin.  Signed, Cannon Kettle, PA-C  07/28/2023 [facility]

## 2023-07-28 ENCOUNTER — Ambulatory Visit: Payer: Medicare Other | Attending: Physician Assistant | Admitting: Physician Assistant

## 2023-07-28 ENCOUNTER — Encounter: Payer: Self-pay | Admitting: Physician Assistant

## 2023-07-28 VITALS — BP 150/76 | HR 73 | Ht 68.0 in | Wt 171.4 lb

## 2023-07-28 DIAGNOSIS — I2510 Atherosclerotic heart disease of native coronary artery without angina pectoris: Secondary | ICD-10-CM

## 2023-07-28 DIAGNOSIS — I10 Essential (primary) hypertension: Secondary | ICD-10-CM | POA: Diagnosis not present

## 2023-07-28 DIAGNOSIS — E785 Hyperlipidemia, unspecified: Secondary | ICD-10-CM | POA: Diagnosis not present

## 2023-07-28 NOTE — Patient Instructions (Addendum)
Medication Instructions:  Stop Furosemide (Lasix)40 mg as directed. Stop Klor-Con as directed.  *If you need a refill on your cardiac medications before your next appointment, please call your pharmacy*   Lab Work: NONE ordered at this time of appointment    Testing/Procedures: NONE ordered at this time of appointment     Follow-Up: At [facility], you and your health needs are our priority.  As part of our continuing mission to provide you with exceptional heart care, we have created designated Provider Care Teams.  These Care Teams include your primary Cardiologist (physician) and Advanced Practice Providers (APPs -  Physician Assistants and Nurse Practitioners) who all work together to provide you with the care you need, when you need it.  We recommend signing up for the patient portal called "MyChart".  Sign up information is provided on this After Visit Summary.  MyChart is used to connect with patients for Virtual Visits (Telemedicine).  Patients are able to view lab/test results, encounter notes, upcoming appointments, etc.  Non-urgent messages can be sent to your provider as well.   To learn more about what you can do with MyChart, go to [email or web address].    Your next appointment:   4 month(s)  Provider:   Little Ishikawa, MD     Other Instructions Discussed recommendations of Cardiac Rehab

## 2023-08-03 ENCOUNTER — Other Ambulatory Visit: Payer: Self-pay | Admitting: Physician Assistant

## 2023-08-17 ENCOUNTER — Other Ambulatory Visit: Payer: Self-pay | Admitting: Surgery

## 2023-08-17 DIAGNOSIS — I2510 Atherosclerotic heart disease of native coronary artery without angina pectoris: Secondary | ICD-10-CM

## 2023-08-18 ENCOUNTER — Ambulatory Visit
Admission: RE | Admit: 2023-08-18 | Discharge: 2023-08-18 | Disposition: A | Discharge status: Home / Self Care | Payer: Medicare Other | Source: Ambulatory Visit | Attending: Surgery | Admitting: Surgery

## 2023-08-18 ENCOUNTER — Encounter: Payer: Self-pay | Admitting: Surgery

## 2023-08-18 ENCOUNTER — Ambulatory Visit (INDEPENDENT_AMBULATORY_CARE_PROVIDER_SITE_OTHER): Payer: Self-pay | Admitting: Surgery

## 2023-08-18 VITALS — BP 171/81 | HR 70 | Resp 18 | Ht 68.0 in | Wt 172.0 lb

## 2023-08-18 DIAGNOSIS — Z951 Presence of aortocoronary bypass graft: Secondary | ICD-10-CM | POA: Diagnosis not present

## 2023-08-18 DIAGNOSIS — J90 Pleural effusion, not elsewhere classified: Secondary | ICD-10-CM | POA: Diagnosis not present

## 2023-08-18 DIAGNOSIS — I2510 Atherosclerotic heart disease of native coronary artery without angina pectoris: Secondary | ICD-10-CM

## 2023-08-18 NOTE — Progress Notes (Unsigned)
    HPI: Patient returns for routine postoperative follow-up having undergone coronary artery bypass graft surgery x 2 on 07/08/2023. The patient's early postoperative recovery while in the hospital was notable for an uncomplicated postoperative course. Since hospital discharge the patient reports that he has been feeling well.  He is walking daily without chest pain or shortness of breath.  He is here today with his wife.   Current Outpatient Medications  Medication Sig Dispense Refill   aspirin EC 325 MG tablet Take 1 tablet (325 mg total) by mouth daily.     atorvastatin (LIPITOR) 40 MG tablet Take 1 tablet (40 mg total) by mouth daily. 30 tablet 5   calcium carbonate (TUMS - DOSED IN MG ELEMENTAL CALCIUM) 500 MG chewable tablet Chew 2 tablets by mouth daily as needed for indigestion.     ezetimibe (ZETIA) 10 MG tablet Take 1 tablet (10 mg total) by mouth daily. 90 tablet 3   finasteride (PROSCAR) 5 MG tablet Take 5 mg by mouth daily in the afternoon.     metoprolol succinate (TOPROL-XL) 25 MG 24 hr tablet Take 1 tablet (25 mg total) by mouth in the morning. 30 tablet 1   nitroGLYCERIN (NITROSTAT) 0.4 MG SL tablet Place 0.4 mg under the tongue every 5 (five) minutes as needed for chest pain.     omeprazole (PRILOSEC) 20 MG capsule Take 20 mg by mouth daily as needed (acid reflux).     traMADol (ULTRAM) 50 MG tablet Take 1 tablet (50 mg total) by mouth every 6 (six) hours as needed for moderate pain. 28 tablet 0   No current facility-administered medications for this visit.    Physical Exam: BP (!) 171/81 (BP Location: Left Arm, Patient Position: Sitting)   Pulse 70   Resp 18   Ht 5\' 8"  (1.727 m)   Wt 172 lb (78 kg)   SpO2 99% Comment: RA  BMI 26.15 kg/m  He looks well. Cardiac exam shows a regular rate and rhythm with normal heart sounds. Lungs are clear. The chest incision is healing well and the sternum is stable. His leg incision is healing well and there is no peripheral  edema.  Diagnostic Tests:  Narrative & Impression  CLINICAL DATA:  cabg   EXAM: CHEST - 2 VIEW   COMPARISON:  Chest x-ray 07/10/2023.   FINDINGS: The heart size and mediastinal contours are within normal limits. CABG and median sternotomy. Both lungs are clear. Small left pleural effusion. No visible pneumothorax. No acute osseous abnormality.   IMPRESSION: Small left pleural effusion. Otherwise, no active cardiopulmonary disease.     Electronically Signed   By: Feliberto Harts M.D.   On: 08/18/2023 14:00    Impression:  Overall I think he is making good progress approximately 5 weeks out from his surgery.  He feels much better than he did preoperatively and is walking without any symptoms.  I discussed cardiac rehab with him but he thinks that he would like to do it on his own at home.  I told him he can return to driving a car but should refrain lifting anything heavier than 10 pounds for 3 months postoperatively.  Plan:  He will continue to follow-up with cardiology and will contact me if he develops any problems with his incisions.   [name], MD [facility] [phone]

## 2023-08-23 ENCOUNTER — Telehealth (HOSPITAL_COMMUNITY): Payer: Self-pay

## 2023-08-23 NOTE — Telephone Encounter (Signed)
Called and spoke with pt in regards to CR, pt stated he is working out at home and is not interested at this time.   Closed referral

## 2023-09-05 ENCOUNTER — Other Ambulatory Visit: Payer: Self-pay | Admitting: Physician Assistant

## 2023-09-20 ENCOUNTER — Other Ambulatory Visit: Payer: Self-pay | Admitting: Physician Assistant

## 2023-11-06 ENCOUNTER — Other Ambulatory Visit: Payer: Self-pay | Admitting: Cardiology

## 2023-12-30 DIAGNOSIS — I10 Essential (primary) hypertension: Secondary | ICD-10-CM | POA: Diagnosis not present

## 2023-12-30 DIAGNOSIS — E782 Mixed hyperlipidemia: Secondary | ICD-10-CM | POA: Diagnosis not present

## 2024-01-05 DIAGNOSIS — I10 Essential (primary) hypertension: Secondary | ICD-10-CM | POA: Diagnosis not present

## 2024-01-05 DIAGNOSIS — E782 Mixed hyperlipidemia: Secondary | ICD-10-CM | POA: Diagnosis not present

## 2024-01-05 DIAGNOSIS — Z0000 Encounter for general adult medical examination without abnormal findings: Secondary | ICD-10-CM | POA: Diagnosis not present

## 2024-01-06 DIAGNOSIS — I10 Essential (primary) hypertension: Secondary | ICD-10-CM | POA: Diagnosis not present

## 2024-04-13 ENCOUNTER — Encounter: Payer: Self-pay | Admitting: Cardiology

## 2024-04-19 ENCOUNTER — Encounter (HOSPITAL_BASED_OUTPATIENT_CLINIC_OR_DEPARTMENT_OTHER): Payer: Self-pay | Admitting: Cardiology

## 2024-04-19 NOTE — Telephone Encounter (Signed)
Routing to correct triage pool

## 2024-06-12 ENCOUNTER — Encounter: Payer: Self-pay | Admitting: Cardiology

## 2024-06-13 ENCOUNTER — Telehealth (HOSPITAL_BASED_OUTPATIENT_CLINIC_OR_DEPARTMENT_OTHER): Payer: Self-pay | Admitting: *Deleted

## 2024-06-13 ENCOUNTER — Encounter: Payer: Self-pay | Admitting: Cardiology

## 2024-06-13 ENCOUNTER — Other Ambulatory Visit: Payer: Self-pay | Admitting: *Deleted

## 2024-06-13 DIAGNOSIS — I10 Essential (primary) hypertension: Secondary | ICD-10-CM

## 2024-06-13 DIAGNOSIS — E785 Hyperlipidemia, unspecified: Secondary | ICD-10-CM

## 2024-06-13 NOTE — Telephone Encounter (Signed)
 Called and spoke to patient and per patient there was a system mixed up and he never received a call back or an follow up appointment  scheduled. Patient verbalized he only wants to see Dr. Kate. Appointment made for patient and patient made aware that Dr. Kate will be notified for an earlier appointment.Made patient aware that if Dr. Kate has an appointment earlier he will be notified and added to that visit. Understanding Verbalized

## 2024-06-13 NOTE — Telephone Encounter (Signed)
 Called and made patient aware that he has appointment scheduled with Dr. Kate 7/29@10 :00. Understanding verbalized.

## 2024-06-13 NOTE — Telephone Encounter (Signed)
 Recommend CMET, CBC, and fasting lipid panel prior to clinic visit

## 2024-06-13 NOTE — Telephone Encounter (Signed)
 Provided information to Dustin Mcclure, nursing supervisor for follow up.

## 2024-06-13 NOTE — Progress Notes (Signed)
 Called and made patient aware per Dr. Kate to have labs done before appointment for Lipid Panel, CBC and CMET. Understanding  verbalized.

## 2024-06-15 ENCOUNTER — Other Ambulatory Visit: Payer: Self-pay | Admitting: *Deleted

## 2024-06-15 DIAGNOSIS — E785 Hyperlipidemia, unspecified: Secondary | ICD-10-CM

## 2024-06-15 DIAGNOSIS — I10 Essential (primary) hypertension: Secondary | ICD-10-CM | POA: Diagnosis not present

## 2024-06-15 LAB — COMPREHENSIVE METABOLIC PANEL WITH GFR
ALT: 13 IU/L (ref 0–44)
AST: 17 IU/L (ref 0–40)
Albumin: 4.1 g/dL (ref 3.7–4.7)
Alkaline Phosphatase: 63 IU/L (ref 44–121)
BUN/Creatinine Ratio: 17 (ref 10–24)
BUN: 14 mg/dL (ref 8–27)
Bilirubin Total: 1 mg/dL (ref 0.0–1.2)
CO2: 23 mmol/L (ref 20–29)
Calcium: 9.3 mg/dL (ref 8.6–10.2)
Chloride: 100 mmol/L (ref 96–106)
Creatinine, Ser: 0.82 mg/dL (ref 0.76–1.27)
Globulin, Total: 2 g/dL (ref 1.5–4.5)
Glucose: 93 mg/dL (ref 70–99)
Potassium: 4.5 mmol/L (ref 3.5–5.2)
Sodium: 137 mmol/L (ref 134–144)
Total Protein: 6.1 g/dL (ref 6.0–8.5)
eGFR: 88 mL/min/1.73 (ref >59)

## 2024-06-16 ENCOUNTER — Ambulatory Visit: Payer: Self-pay | Admitting: Cardiology

## 2024-06-16 LAB — CBC
Hematocrit: 41.3 % (ref 37.5–51.0)
Hemoglobin: 13.5 g/dL (ref 13.0–17.7)
MCH: 30.3 pg (ref 26.6–33.0)
MCHC: 32.7 g/dL (ref 31.5–35.7)
MCV: 93 fL (ref 79–97)
Platelets: 212 x10E3/uL (ref 150–450)
RBC: 4.46 x10E6/uL (ref 4.14–5.80)
RDW: 13.5 % (ref 11.6–15.4)
WBC: 5.4 x10E3/uL (ref 3.4–10.8)

## 2024-06-16 LAB — LIPID PANEL
Chol/HDL Ratio: 2.1 ratio (ref 0.0–5.0)
Cholesterol, Total: 116 mg/dL (ref 100–199)
HDL: 55 mg/dL (ref >39)
LDL Chol Calc (NIH): 50 mg/dL (ref 0–99)
Triglycerides: 46 mg/dL (ref 0–149)
VLDL Cholesterol Cal: 11 mg/dL (ref 5–40)

## 2024-06-18 NOTE — Progress Notes (Unsigned)
 Cardiology Office Note:    Date:  06/20/2024   ID:  Dustin Mcclure, DOB September 19, 1941, MRN [id number]  PCP:  Okey Carlin Redbird, MD  Cardiologist:  Lonni LITTIE Nanas, MD  Electrophysiologist:  None   Referring MD: Okey Carlin Redbird, MD   Chief Complaint  Patient presents with   Coronary Artery Disease    History of Present Illness:    Dustin Mcclure is a 83 y.o. male with a hx of CAD status post CABG, hypertension, hyperlipidemia who presents for follow-up.    Echocardiogram 02/11/2022 showed EF 65 to 70%, grade 1 diastolic dysfunction, normal RV function, no significant valvular disease.  Lexiscan Myoview  on 02/11/2022 showed small fixed apical lesion (suspect artifact), EF 55%.  Zio patch x 13.5 days on 01/2023 showed 1 episode of NSVT lasting 6 beats, junctional rhythm present.  Echocardiogram 05/2023 showed EF 60 to 65%, normal RV function, no significant valvular disease.  Coronary CTA 05/2023 concerning for severe multivessel disease, calcium  score 4273.  LHC 06/22/2023 showed CTO mid RCA, 80% proximal LCx, 60% mid LAD (positive on FFR).  Underwent CABG on 06/2023 with LIMA-LAD, SVG-OM.  Since last clinic visit, he reports he is doing well.  Denies any chest pain, dyspnea, lightheadedness, syncope, or palpitations.  He walks 15 minutes 5 days/week on treadmill, denies exertional symptoms.  Reports heart rate dropping to 40s when checks at home and BP up to 140s over 70s.  Past Medical History:  Diagnosis Date   BPH (benign prostatic hyperplasia)    CAD (coronary artery disease)    HTN (hypertension)    Hypercholesteremia     Past Surgical History:  Procedure Laterality Date   CATARACT EXTRACTION     CORONARY ANGIOPLASTY  1990   mid-LAD   CORONARY ARTERY BYPASS GRAFT N/A 07/08/2023   Procedure: CORONARY ARTERY BYPASS GRAFTING (CABG) x2 USING LEFT INTERNAL MAMMARY ARTERY (LIMA) AND ENDOSCOPICALLY HARVESTED RIGHT GREATER SAPHENOUS VEIN;  Surgeon: Lucas Dorise POUR, MD;  Location: [facility]  OR;  Service: Open Heart Surgery;  Laterality: N/A;   CORONARY PRESSURE/FFR WITH 3D MAPPING N/A 06/22/2023   Procedure: Coronary Pressure/FFR w/3D Mapping;  Surgeon: Wendel Lurena POUR, MD;  Location: [facility] INVASIVE CV LAB;  Service: Cardiovascular;  Laterality: N/A;   INGUINAL HERNIA REPAIR Bilateral    LEFT HEART CATH AND CORONARY ANGIOGRAPHY N/A 06/22/2023   Procedure: LEFT HEART CATH AND CORONARY ANGIOGRAPHY;  Surgeon: Wendel Lurena POUR, MD;  Location: [facility] INVASIVE CV LAB;  Service: Cardiovascular;  Laterality: N/A;   TEE WITHOUT CARDIOVERSION N/A 07/08/2023   Procedure: TRANSESOPHAGEAL ECHOCARDIOGRAM;  Surgeon: Lucas Dorise POUR, MD;  Location: [facility] OR;  Service: Open Heart Surgery;  Laterality: N/A;    Current Medications: Current Meds  Medication Sig   amLODipine  (NORVASC ) 5 MG tablet Take 1 tablet (5 mg total) by mouth daily.   aspirin  EC 81 MG tablet Take 81 mg by mouth daily. Swallow whole.   atorvastatin  (LIPITOR) 40 MG tablet Take 1 tablet (40 mg total) by mouth daily.   calcium  carbonate (TUMS - DOSED IN MG ELEMENTAL CALCIUM ) 500 MG chewable tablet Chew 2 tablets by mouth daily as needed for indigestion.   ezetimibe  (ZETIA ) 10 MG tablet TAKE 1 TABLET BY MOUTH EVERY DAY   finasteride  (PROSCAR ) 5 MG tablet Take 5 mg by mouth daily in the afternoon.   omeprazole  (PRILOSEC) 20 MG capsule Take 20 mg by mouth daily as needed (acid reflux).   [DISCONTINUED] aspirin  EC 325 MG tablet Take 1 tablet (325 mg total)  by mouth daily.   [DISCONTINUED] metoprolol  succinate (TOPROL -XL) 25 MG 24 hr tablet Take 1 tablet (25 mg total) by mouth in the morning.   [DISCONTINUED] nitroGLYCERIN  (NITROSTAT ) 0.4 MG SL tablet Place 0.4 mg under the tongue every 5 (five) minutes as needed for chest pain.     Allergies:   Patient has no known allergies.   Social History   Socioeconomic History   Marital status: Married    Spouse name: Not on file   Number of children: Not on file   Years of education: Not on file    Highest education level: Not on file  Occupational History   Not on file  Tobacco Use   Smoking status: Former    Current packs/day: 0.00    Types: Cigarettes    Quit date: 36    Years since quitting: 52.6   Smokeless tobacco: Never  Vaping Use   Vaping status: Never Used  Substance and Sexual Activity   Alcohol use: Yes    Comment: 1 drink a day   Drug use: Never   Sexual activity: Not on file  Other Topics Concern   Not on file  Social History Narrative   Not on file   Social Drivers of Health   Financial Resource Strain: Not on file  Food Insecurity: No Food Insecurity (06/20/2023)   Hunger Vital Sign    Worried About Running Out of Food in the Last Year: Never true    Ran Out of Food in the Last Year: Never true  Transportation Needs: No Transportation Needs (06/20/2023)   PRAPARE - Administrator, Civil Service (Medical): No    Lack of Transportation (Non-Medical): No  Physical Activity: Not on file  Stress: Not on file  Social Connections: Not on file     Family History: The patient's family history includes Heart attack in his brother.  ROS:   Please see the history of present illness.     All other systems reviewed and are negative.  EKGs/Labs/Other Studies Reviewed:    The following studies were reviewed today:   EKG:   01/15/22: Normal sinus rhythm, rate 62, T wave inversions in leads aVL, V2/3 01/20/23: NSR, rate 57 06/20/24: Sinus bradycardia, rate 55, T wave inversions in leads V1-5  Recent Labs: 06/22/2023: TSH 4.651 07/11/2023: Magnesium  2.1 06/15/2024: ALT 13; BUN 14; Creatinine, Ser 0.82; Hemoglobin 13.5; Platelets 212; Potassium 4.5; Sodium 137  Recent Lipid Panel    Component Value Date/Time   CHOL 116 06/15/2024 0825   TRIG 46 06/15/2024 0825   HDL 55 06/15/2024 0825   CHOLHDL 2.1 06/15/2024 0825   CHOLHDL 2.1 06/20/2023 0535   VLDL 8 06/20/2023 0535   LDLCALC 50 06/15/2024 0825    Physical Exam:    VS:  BP (!)  140/60 (BP Location: Left Arm)   Pulse (!) 55   Ht 5' 8 (1.727 m)   Wt 167 lb (75.8 kg)   SpO2 97%   BMI 25.39 kg/m     Wt Readings from Last 3 Encounters:  06/20/24 167 lb (75.8 kg)  08/18/23 172 lb (78 kg)  07/28/23 171 lb 6.4 oz (77.7 kg)     GEN:  Well nourished, well developed in no acute distress HEENT: Normal NECK: No JVD; No carotid bruits LYMPHATICS: No lymphadenopathy CARDIAC: RRR, no murmurs, rubs, gallops RESPIRATORY:  Clear to auscultation without rales, wheezing or rhonchi  ABDOMEN: Soft, non-tender, non-distended MUSCULOSKELETAL:  trace edema; No deformity  SKIN: Warm  and dry NEUROLOGIC:  Alert and oriented x 3 PSYCHIATRIC:  Normal affect   ASSESSMENT:    1. Coronary artery disease involving native coronary artery of native heart without angina pectoris   2. Essential hypertension   3. Hyperlipidemia, unspecified hyperlipidemia type   4. Palpitations      PLAN:    CAD: Status post angioplasty 1990 to mid LAD.  On aspirin , atorvastatin , metoprolol .  Echocardiogram 05/2023 showed EF 60 to 65%, normal RV function, no significant valvular disease.  Coronary CTA 05/2023 concerning for severe multivessel disease, calcium  score 4273.  LHC 06/22/2023 showed CTO mid RCA, 80% proximal LCx, 60% mid LAD (positive on FFR).  Underwent CABG on 06/2023 with LIMA-LAD, SVG-OM. - Continue aspirin , can reduce to 81 mg daily - Continue atorvastatin  40 mg daily and Zetia  10 mg daily.  LDL 50 on 06/15/2024  Palpitations: Zio patch x 13.5 days on 01/2023 showed 1 episode of NSVT lasting 6 beats, junctional rhythm present.  He was reporting heart rate 140s when checked at home, will discontinue metoprolol   Hyperlipidemia: On atorvastatin  40 mg every other day and Zetia  10 mg daily.  LDL 50 on 06/15/2024  Hypertension: On Toprol -XL 25 mg daily.  Reports has been having low resting HR, down to 40s when he checks at home, recommend stopping Toprol -XL.  Will switch to amlodipine  5 mg daily.   Check BP once daily for next 2 weeks and let us  know results.  RTC in 6 months   Medication Adjustments/Labs and Tests Ordered: Current medicines are reviewed at length with the patient today.  Concerns regarding medicines are outlined above.  Orders Placed This Encounter  Procedures   EKG 12-Lead   Meds ordered this encounter  Medications   amLODipine  (NORVASC ) 5 MG tablet    Sig: Take 1 tablet (5 mg total) by mouth daily.    Dispense:  90 tablet    Refill:  3    Patient Instructions  Medication Instructions:  Stop Metoprolol  as discussed with your provider Start Amlodipine  5mg  daily *If you need a refill on your cardiac medications before your next appointment, please call your pharmacy*  Lab Work: none If you have labs (blood work) drawn today and your tests are completely normal, you will receive your results only by: MyChart Message (if you have MyChart) OR A paper copy in the mail If you have any lab test that is abnormal or we need to change your treatment, we will call you to review the results.  Testing/Procedures: none  Follow-Up: At [facility], you and your health needs are our priority.  As part of our continuing mission to provide you with exceptional heart care, our providers are all part of one team.  This team includes your primary Cardiologist (physician) and Advanced Practice Providers or APPs (Physician Assistants and Nurse Practitioners) who all work together to provide you with the care you need, when you need it.  Your next appointment:   6 month(s)  Provider:   Lonni LITTIE Nanas, MD    We recommend signing up for the patient portal called MyChart.  Sign up information is provided on this After Visit Summary.  MyChart is used to connect with patients for Virtual Visits (Telemedicine).  Patients are able to view lab/test results, encounter notes, upcoming appointments, etc.  Non-urgent messages can be sent to your provider as well.    To learn more about what you can do with MyChart, go to [email or web address].   Other Instructions Please  check blood pressure once a day for next two weeks and send those reading via [employer], Lonni LITTIE Nanas, MD  06/20/2024 11:21 AM    [facility]

## 2024-06-20 ENCOUNTER — Ambulatory Visit: Attending: Cardiology | Admitting: Cardiology

## 2024-06-20 VITALS — BP 140/60 | HR 55 | Ht 68.0 in | Wt 167.0 lb

## 2024-06-20 DIAGNOSIS — I2510 Atherosclerotic heart disease of native coronary artery without angina pectoris: Secondary | ICD-10-CM

## 2024-06-20 DIAGNOSIS — R002 Palpitations: Secondary | ICD-10-CM

## 2024-06-20 DIAGNOSIS — I10 Essential (primary) hypertension: Secondary | ICD-10-CM | POA: Diagnosis not present

## 2024-06-20 DIAGNOSIS — E785 Hyperlipidemia, unspecified: Secondary | ICD-10-CM

## 2024-06-20 MED ORDER — AMLODIPINE BESYLATE 5 MG PO TABS: 5.0000 mg | ORAL_TABLET | Freq: Every day | ORAL | 3 refills | Status: AC

## 2024-06-20 NOTE — Patient Instructions (Signed)
 Medication Instructions:  Stop Metoprolol  as discussed with your provider Start Amlodipine  5mg  daily *If you need a refill on your cardiac medications before your next appointment, please call your pharmacy*  Lab Work: none If you have labs (blood work) drawn today and your tests are completely normal, you will receive your results only by: MyChart Message (if you have MyChart) OR A paper copy in the mail If you have any lab test that is abnormal or we need to change your treatment, we will call you to review the results.  Testing/Procedures: none  Follow-Up: At [facility], you and your health needs are our priority.  As part of our continuing mission to provide you with exceptional heart care, our providers are all part of one team.  This team includes your primary Cardiologist (physician) and Advanced Practice Providers or APPs (Physician Assistants and Nurse Practitioners) who all work together to provide you with the care you need, when you need it.  Your next appointment:   6 month(s)  Provider:   Lonni LITTIE Nanas, MD    We recommend signing up for the patient portal called MyChart.  Sign up information is provided on this After Visit Summary.  MyChart is used to connect with patients for Virtual Visits (Telemedicine).  Patients are able to view lab/test results, encounter notes, upcoming appointments, etc.  Non-urgent messages can be sent to your provider as well.   To learn more about what you can do with MyChart, go to [email or web address].   Other Instructions Please check blood pressure once a day for next two weeks and send those reading via mychart

## 2024-07-04 ENCOUNTER — Encounter: Payer: Self-pay | Admitting: Cardiology

## 2024-07-05 ENCOUNTER — Telehealth: Payer: Self-pay | Admitting: Cardiology

## 2024-07-05 NOTE — Telephone Encounter (Signed)
 Paper Work Dropped Off:  BP readings  Date:07-05-24  Location of paper:  Put in Dr [facility]	[place]

## 2024-07-10 NOTE — Telephone Encounter (Signed)
 Called and made patient aware that per Dr. Kate blood pressure look good.  Per patient provider has call and no recommends we're made. Per patient provider verbalized blood pressure look good. Made patient aware to call office for any questions. Understanding verbalized

## 2024-07-13 ENCOUNTER — Ambulatory Visit: Admitting: Cardiology

## 2024-11-14 ENCOUNTER — Encounter: Payer: Self-pay | Admitting: Cardiology

## 2024-11-14 NOTE — Telephone Encounter (Signed)
 Let's try cutting the amlodipine  dose to 2.5 mg daily to see if helps with side effects.  Would check BP daily and bring log to appointment

## 2024-11-17 ENCOUNTER — Encounter: Payer: Self-pay | Admitting: Cardiology

## 2024-11-19 NOTE — Telephone Encounter (Signed)
 We can hold off on lab work

## 2024-12-18 ENCOUNTER — Ambulatory Visit: Admitting: Cardiology

## 2025-03-21 ENCOUNTER — Ambulatory Visit: Admitting: Cardiology
